# Patient Record
Sex: Male | Born: 1980 | Race: Black or African American | Hispanic: No | Marital: Married | Smoking: Never smoker
Health system: Southern US, Community
[De-identification: ages and names within clinical notes are randomized; demographics above are authoritative.]

## PROBLEM LIST (undated history)

## (undated) DIAGNOSIS — I10 Essential (primary) hypertension: Secondary | ICD-10-CM

## (undated) DIAGNOSIS — G4730 Sleep apnea, unspecified: Secondary | ICD-10-CM

## (undated) DIAGNOSIS — G4710 Hypersomnia, unspecified: Secondary | ICD-10-CM

## (undated) HISTORY — DX: Essential (primary) hypertension: I10

## (undated) HISTORY — DX: Hypersomnia, unspecified: G47.10

---

## 2003-01-12 ENCOUNTER — Emergency Department (HOSPITAL_COMMUNITY): Admission: EM | Admit: 2003-01-12 | Discharge: 2003-01-12 | Payer: Self-pay | Admitting: Emergency Medicine

## 2004-10-17 ENCOUNTER — Emergency Department (HOSPITAL_COMMUNITY): Admission: EM | Admit: 2004-10-17 | Discharge: 2004-10-18 | Payer: Self-pay | Admitting: Emergency Medicine

## 2009-02-18 ENCOUNTER — Emergency Department (HOSPITAL_COMMUNITY): Admission: EM | Admit: 2009-02-18 | Discharge: 2009-02-18 | Payer: Self-pay | Admitting: Family Medicine

## 2016-06-11 DIAGNOSIS — R030 Elevated blood-pressure reading, without diagnosis of hypertension: Secondary | ICD-10-CM | POA: Diagnosis not present

## 2016-06-11 DIAGNOSIS — J020 Streptococcal pharyngitis: Secondary | ICD-10-CM | POA: Diagnosis not present

## 2017-01-15 DIAGNOSIS — R030 Elevated blood-pressure reading, without diagnosis of hypertension: Secondary | ICD-10-CM | POA: Diagnosis not present

## 2017-01-15 DIAGNOSIS — J039 Acute tonsillitis, unspecified: Secondary | ICD-10-CM | POA: Diagnosis not present

## 2017-03-28 DIAGNOSIS — J020 Streptococcal pharyngitis: Secondary | ICD-10-CM | POA: Diagnosis not present

## 2017-12-08 DIAGNOSIS — H04123 Dry eye syndrome of bilateral lacrimal glands: Secondary | ICD-10-CM | POA: Diagnosis not present

## 2017-12-08 DIAGNOSIS — H40033 Anatomical narrow angle, bilateral: Secondary | ICD-10-CM | POA: Diagnosis not present

## 2018-02-19 ENCOUNTER — Ambulatory Visit: Payer: BLUE CROSS/BLUE SHIELD | Admitting: Internal Medicine

## 2018-02-19 ENCOUNTER — Encounter: Payer: Self-pay | Admitting: Internal Medicine

## 2018-02-19 VITALS — BP 148/88 | HR 71 | Temp 97.7°F | Ht 71.0 in | Wt 232.2 lb

## 2018-02-19 DIAGNOSIS — J302 Other seasonal allergic rhinitis: Secondary | ICD-10-CM

## 2018-02-19 DIAGNOSIS — G4710 Hypersomnia, unspecified: Secondary | ICD-10-CM | POA: Diagnosis not present

## 2018-02-19 DIAGNOSIS — J351 Hypertrophy of tonsils: Secondary | ICD-10-CM | POA: Diagnosis not present

## 2018-02-19 DIAGNOSIS — I10 Essential (primary) hypertension: Secondary | ICD-10-CM

## 2018-02-19 DIAGNOSIS — R0683 Snoring: Secondary | ICD-10-CM | POA: Diagnosis not present

## 2018-02-19 MED ORDER — LISINOPRIL 2.5 MG PO TABS: 2.5000 mg | ORAL_TABLET | Freq: Every day | ORAL | 0 refills | Status: DC

## 2018-02-19 MED ORDER — FEXOFENADINE HCL 180 MG PO TABS: 180.0000 mg | ORAL_TABLET | Freq: Every day | ORAL | 0 refills | Status: DC

## 2018-02-19 MED ORDER — FEXOFENADINE-PSEUDOEPHED ER 60-120 MG PO TB12: 1.0000 | ORAL_TABLET | Freq: Two times a day (BID) | ORAL | 0 refills | Status: DC

## 2018-02-19 MED ORDER — FLUTICASONE PROPIONATE 50 MCG/ACT NA SUSP: 2.0000 | NASAL | 0 refills | Status: DC

## 2018-02-19 NOTE — Patient Instructions (Signed)
Sleep Apnea Sleep apnea is a condition that affects breathing. People with sleep apnea have moments during sleep when their breathing pauses briefly or gets shallow. Sleep apnea can cause these symptoms:  Trouble staying asleep.  Sleepiness or tiredness during the day.  Irritability.  Loud snoring.  Morning headaches.  Trouble concentrating.  Forgetting things.  Less interest in sex.  Being sleepy for no reason.  Mood swings.  Personality changes.  Depression.  Waking up a lot during the night to pee (urinate).  Dry mouth.  Sore throat.  Follow these instructions at home:  Make any changes in your routine that your doctor recommends.  Eat a healthy, well-balanced diet.  Take over-the-counter and prescription medicines only as told by your doctor.  Avoid using alcohol, calming medicines (sedatives), and narcotic medicines.  Take steps to lose weight if you are overweight.  If you were given a machine (device) to use while you sleep, use it only as told by your doctor.  Do not use any tobacco products, such as cigarettes, chewing tobacco, and e-cigarettes. If you need help quitting, ask your doctor.  Keep all follow-up visits as told by your doctor. This is important. Contact a doctor if:  The machine that you were given to use during sleep is uncomfortable or does not seem to be working.  Your symptoms do not get better.  Your symptoms get worse. Get help right away if:  Your chest hurts.  You have trouble breathing in enough air (shortness of breath).  You have an uncomfortable feeling in your back, arms, or stomach.  You have trouble talking.  One side of your body feels weak.  A part of your face is hanging down (drooping). These symptoms may be an emergency. Do not wait to see if the symptoms will go away. Get medical help right away. Call your local emergency services (911 in the [place] Do not drive yourself to the hospital. This information  is not intended to replace advice given to you by your health care provider. Make sure you discuss any questions you have with your health care provider. Document Released: 01/04/2008 Document Revised: 11/21/2015 Document Reviewed: 01/04/2015 Elsevier Interactive Patient Education  2018 [employer]. Narcolepsy Narcolepsy is a neurological disorder that causes you to fall asleep suddenly, and without control, during the daytime (sleep attacks). Narcolepsy is a lifelong (chronic) disorder. Normally, sleep follows a regular cycle over the course of the night. After about 90 minutes of light sleep, your sleep should become deeper. When your sleep becomes deeper, your body moves less and you start dreaming. This type of deep sleep is called rapid eye movement (REM) sleep. When you have narcolepsy, your REM sleep is not well-regulated. This disrupts your sleep cycle, which causes daytime sleepiness. What are the causes? The cause of narcolepsy is not fully understood, but it may be related to:  Low levels of hypocretin, a chemical (neurotransmitter) in the brain that controls sleep and wake cycles. Hypocretin imbalance may be caused by: ? Abnormal genes that are passed from parent to child (inherited). ? The body's defense system (immune system) attacking hypocretin brain cells (autoimmune disease).  Infection, tumor, or injury in the area of the brain that controls sleep.  Exposure to poisons (toxins), such as heavy metals, pesticides, and secondhand smoke.  What are the signs or symptoms? Symptoms of this condition include:  Excessive daytime sleepiness. This is the most common symptom and is usually the first symptom you will notice. This may  affect your performance at work or school.  Sleep attacks. This means falling asleep suddenly and without control. You may fall asleep in the middle of an activity, especially low-energy activities like reading or watching TV.  Feeling like you cannot  think clearly.  Trouble focusing or remembering things.  Feeling depressed.  Sudden muscle weakness (cataplexy). When this occurs, your speech may become slurred, or your knees may buckle. Cataplexy is usually triggered by surprise, anger, fear, or laughter.  Loss of the ability to speak or move (sleep paralysis). This may occur just as you start to fall asleep or wake up. You will be aware of the paralysis. It usually lasts for just a few seconds or minutes.  Seeing, hearing, tasting, smelling, or feeling things that are not real (hallucinations). Hallucinations may occur with sleep paralysis. They can happen when you are falling asleep, waking up, or dozing.  Trouble staying asleep at night (insomnia).  Restless sleep.  How is this diagnosed? This condition may be diagnosed based on:  A physical exam to rule out any other problems that may be causing your symptoms.You may be asked to write down your sleeping patterns for several weeks in a sleep diary. This will help your health care provider make a diagnosis.  Sleep studies that measure how well your REM sleep is regulated. These tests also measure your heart rate, breathing, movement, and brain waves. These tests include: ? An overnight sleep study (polysomnogram). ? A daytime sleep study that is done while you take several naps during the day (multiple sleep latency test, MSLT). This test measures how quickly you fall asleep and how quickly you enter REM sleep.  Removal of spinal fluid to measure hypocretin levels.  How is this treated? There is no cure for this condition, but treatment can help relieve symptoms. Treatment may include:  Lifestyle and sleeping strategies to help you cope with the condition, such as: ? Exercising regularly. ? Maintaining a regular sleep schedule. ? Avoiding caffeine and large meals before bed.  Medicines. These may include: ? Medicines that help keep you awake and alert (stimulants) to fight  daytime sleepiness. ? Medicines that treat depression (antidepressants). These may be used to treat cataplexy. ? Sodium oxybate. This is a strong medicine to help you relax (sedative) that you may take at night. It can help control daytime sleepiness and cataplexy.  Follow these instructions at home: Sleeping habits  Get about 8 hours of sleep every night.  Go to sleep and get up at about the same time every day.  Keep your bedroom dark, quiet, and comfortable.  When you feel very tired, take short naps. Schedule naps so that you take them at about the same time every day.  Tell your employer or teachers that you have narcolepsy. You may be able to adjust your schedule to include time for naps.  Before bedtime: ? Avoid bright lights and screens. ? Relax. Try activities like reading or taking a warm bath. Activity  Get at least 20 minutes of exercise every day. This will help you sleep better at night and reduce daytime sleepiness.  Avoid exercising within 3 hours of bedtime.  If you are sleepy, do not drive or use heavy machinery.  If possible, take a nap before driving.  Do not swim or go out on the water without a life jacket. Eating and drinking  Do not drink alcohol or caffeinated beverages within 4-5 hours of bedtime.  Do not eat a lot of  food before bedtime. Eat meals at about the same times every day. General instructions  Take over-the-counter and prescription medicines only as told by your health care provider.  If directed, keep a sleep diary.  Tell your employer or teachers that you have narcolepsy. You may be able to adjust your schedule to include time for naps.  Do not use any products that contain nicotine or tobacco, such as cigarettes and e-cigarettes. If you need help quitting, ask your health care provider.  Keep all follow-up visits as told by your health care provider. This is important. Contact a health care provider if:  Your symptoms are not  getting better.  You have increasingly high blood pressure (hypertension).  You have changes in your heart rhythm.  You are having a hard time determining what is real and what is not (psychosis). Get help right away if:  You hurt yourself during a sleep attack or an attack of cataplexy.  You have chest pain.  You have trouble breathing. This information is not intended to replace advice given to you by your health care provider. Make sure you discuss any questions you have with your health care provider. Document Released: 03/17/2002 Document Revised: 03/20/2016 Document Reviewed: 03/20/2016 Elsevier Interactive Patient Education  2018 [employer]. Hypertension Hypertension is another name for high blood pressure. High blood pressure forces your heart to work harder to pump blood. This can cause problems over time. There are two numbers in a blood pressure reading. There is a top number (systolic) over a bottom number (diastolic). It is best to have a blood pressure below 120/80. Healthy choices can help lower your blood pressure. You may need medicine to help lower your blood pressure if:  Your blood pressure cannot be lowered with healthy choices.  Your blood pressure is higher than 130/80.  Follow these instructions at home: Eating and drinking  If directed, follow the DASH eating plan. This diet includes: ? Filling half of your plate at each meal with fruits and vegetables. ? Filling one quarter of your plate at each meal with whole grains. Whole grains include whole wheat pasta, brown rice, and whole grain bread. ? Eating or drinking low-fat dairy products, such as skim milk or low-fat yogurt. ? Filling one quarter of your plate at each meal with low-fat (lean) proteins. Low-fat proteins include fish, skinless chicken, eggs, beans, and tofu. ? Avoiding fatty meat, cured and processed meat, or chicken with skin. ? Avoiding premade or processed food.  Eat less than 1,500 mg  of salt (sodium) a day.  Limit alcohol use to no more than 1 drink a day for nonpregnant women and 2 drinks a day for men. One drink equals 12 oz of beer, 5 oz of wine, or 1 oz of hard liquor. Lifestyle  Work with your doctor to stay at a healthy weight or to lose weight. Ask your doctor what the best weight is for you.  Get at least 30 minutes of exercise that causes your heart to beat faster (aerobic exercise) most days of the week. This may include walking, swimming, or biking.  Get at least 30 minutes of exercise that strengthens your muscles (resistance exercise) at least 3 days a week. This may include lifting weights or pilates.  Do not use any products that contain nicotine or tobacco. This includes cigarettes and e-cigarettes. If you need help quitting, ask your doctor.  Check your blood pressure at home as told by your doctor.  Keep all follow-up  visits as told by your doctor. This is important. Medicines  Take over-the-counter and prescription medicines only as told by your doctor. Follow directions carefully.  Do not skip doses of blood pressure medicine. The medicine does not work as well if you skip doses. Skipping doses also puts you at risk for problems.  Ask your doctor about side effects or reactions to medicines that you should watch for. Contact a doctor if:  You think you are having a reaction to the medicine you are taking.  You have headaches that keep coming back (recurring).  You feel dizzy.  You have swelling in your ankles.  You have trouble with your vision. Get help right away if:  You get a very bad headache.  You start to feel confused.  You feel weak or numb.  You feel faint.  You get very bad pain in your: ? Chest. ? Belly (abdomen).  You throw up (vomit) more than once.  You have trouble breathing. Summary  Hypertension is another name for high blood pressure.  Making healthy choices can help lower blood pressure. If your blood  pressure cannot be controlled with healthy choices, you may need to take medicine. This information is not intended to replace advice given to you by your health care provider. Make sure you discuss any questions you have with your health care provider. Document Released: 09/13/2007 Document Revised: 02/23/2016 Document Reviewed: 02/23/2016 Elsevier Interactive Patient Education  [employer]2018 [employer] Inc[employer]

## 2018-02-19 NOTE — Addendum Note (Signed)
Addended by: Radene KneeSOUTHWORTH, Sukari Grist Y on: 02/19/2018 04:47 PM   Modules accepted: Orders

## 2018-02-19 NOTE — Progress Notes (Signed)
Subjective:     Patient ID: Erik West , male    DOB: [birth year] , 37 y.o.   MRN: [id number]   Chief Complaint  Patient presents with  . New Patient (Initial Visit)  . Snoring    C/O snoring really loud at night, stops breathing during sleep   . Fatigue    HPI Wife noticed apnea spells x 1-2 years. Lost wt which did not help.F[place] and was in a wreck. He also has chronic allergies and was told at one point he may need to have his tonsils removed, but never saw ENT.  He falls asleep any time, and sometimes he does not even realize it.    History reviewed. No pertinent past medical history.   History reviewed. No pertinent family history.  No current outpatient medications on file.   Review of Systems  Constitutional: Negative for unexpected weight change.  HENT: Positive for postnasal drip. Negative for ear discharge, ear pain and rhinorrhea.        Always sounds nasal  Respiratory: Negative for shortness of breath.   Cardiovascular: Negative for chest pain and palpitations.  Gastrointestinal: Negative for constipation, diarrhea, nausea and vomiting.       No GERD  Allergic/Immunologic: Positive for environmental allergies.  Neurological:       Hypersomnia, + snorring    Today's Vitals   02/19/18 1156  BP: (!) 148/88  Pulse: 71  Temp: 97.7 F (36.5 C)  TempSrc: Oral  SpO2: 95%  Weight: 232 lb 3.2 oz (105.3 kg)  Height: 5\' 11"  (1.803 m)   Body mass index is 32.39 kg/m.  Repeated BP- 152/110 Objective:  Physical Exam  Constitutional: He is oriented to person, place, and time. He appears well-developed and well-nourished. No distress.  He fell asleep twice while I was seeing his wife  HENT:  Head: Normocephalic.  Right Ear: External ear normal.  Left Ear: External ear normal.  Nose: Nose normal.  Mouth/Throat: Oropharynx is clear and moist. No oropharyngeal exudate.  He sounds nasal. Has moderate swelling of nasal mucosa which is pink pale.   Both TM's normal. Has hypertrophic tonsils almost touching uvula.   Eyes: Conjunctivae are normal. Right eye exhibits no discharge. Left eye exhibits no discharge. No scleral icterus.  Neck: Neck supple. No tracheal deviation present. No thyromegaly present.  Has enlarged tonsillar nodes  Cardiovascular: Normal rate, regular rhythm and normal heart sounds.  No murmur heard. Pulmonary/Chest: Effort normal and breath sounds normal. He has no wheezes. He has no rales.  Lymphadenopathy:    He has cervical adenopathy.  Neurological: He is alert and oriented to person, place, and time.  Skin: Skin is warm and dry. He is not diaphoretic.  Psychiatric: He has a normal mood and affect. His behavior is normal. Judgment and thought content normal.    Assessment And Plan:    1. Seasonal allergies- chronic. Advised to use his allegra  - Ambulatory referral to ENT  2. Snores- sent to Neurology for sleep study - Ambulatory referral to Neurology - Ambulatory referral to ENT  3. Enlarged tonsils- chronic. Sent to see ENT - Ambulatory referral to ENT  4. Hypersomnia- Sent to get eval and sleep study. I discussed with me that he may have Narcolepsy.  - Ambulatory referral to Neurology  5. Essential hypertension- acute. I placed him on Lisinopril 2.5 mg qd. FU in 1 month. Needs to do BP diaries.  - CBC no Diff - TSH -  T3, free - T4, Free - Lipid Profile - CMP - EKG 12-Lead- was normal.     Myliyah Rebuck RODRIGUEZ-SOUTHWORTH, PA-C

## 2018-02-20 LAB — TSH: TSH: 1.73 u[IU]/mL (ref 0.450–4.500)

## 2018-02-20 LAB — CBC
HEMATOCRIT: 45.4 % (ref 37.5–51.0)
HEMOGLOBIN: 15.3 g/dL (ref 13.0–17.7)
MCH: 28.4 pg (ref 26.6–33.0)
MCHC: 33.7 g/dL (ref 31.5–35.7)
MCV: 84 fL (ref 79–97)
Platelets: 263 10*3/uL (ref 150–450)
RBC: 5.38 x10E6/uL (ref 4.14–5.80)
RDW: 13.6 % (ref 12.3–15.4)
WBC: 5.6 10*3/uL (ref 3.4–10.8)

## 2018-02-20 LAB — LIPID PANEL
CHOL/HDL RATIO: 5.2 ratio — AB (ref 0.0–5.0)
Cholesterol, Total: 188 mg/dL (ref 100–199)
HDL: 36 mg/dL — AB (ref >39)
LDL CALC: 133 mg/dL — AB (ref 0–99)
TRIGLYCERIDES: 95 mg/dL (ref 0–149)
VLDL CHOLESTEROL CAL: 19 mg/dL (ref 5–40)

## 2018-02-20 LAB — T4, FREE: Free T4: 1.11 ng/dL (ref 0.82–1.77)

## 2018-02-20 LAB — T3, FREE: T3, Free: 3.1 pg/mL (ref 2.0–4.4)

## 2018-02-22 ENCOUNTER — Encounter: Payer: Self-pay | Admitting: Internal Medicine

## 2018-02-26 ENCOUNTER — Telehealth: Payer: Self-pay

## 2018-02-26 NOTE — Telephone Encounter (Signed)
Pt is on the waitlist for sleep consults. I called pt and offered him two open appts for tomorrow but pt cannot come to appts unless they are after 2pm. Will try again another time.

## 2018-03-11 DIAGNOSIS — J351 Hypertrophy of tonsils: Secondary | ICD-10-CM | POA: Insufficient documentation

## 2018-03-11 DIAGNOSIS — Z87891 Personal history of nicotine dependence: Secondary | ICD-10-CM | POA: Diagnosis not present

## 2018-03-11 DIAGNOSIS — Z7289 Other problems related to lifestyle: Secondary | ICD-10-CM | POA: Diagnosis not present

## 2018-03-11 DIAGNOSIS — G4733 Obstructive sleep apnea (adult) (pediatric): Secondary | ICD-10-CM | POA: Insufficient documentation

## 2018-03-11 DIAGNOSIS — J343 Hypertrophy of nasal turbinates: Secondary | ICD-10-CM | POA: Diagnosis not present

## 2018-03-14 LAB — CMP14 + ANION GAP
ALT: 34 IU/L (ref 0–44)
ANION GAP: 22 mmol/L — AB (ref 10.0–18.0)
AST: 24 IU/L (ref 0–40)
Albumin/Globulin Ratio: 1.5 (ref 1.2–2.2)
Albumin: 4.2 g/dL (ref 3.5–5.5)
Alkaline Phosphatase: 104 IU/L (ref 39–117)
BUN/Creatinine Ratio: 9 (ref 9–20)
BUN: 10 mg/dL (ref 6–20)
Bilirubin Total: 0.4 mg/dL (ref 0.0–1.2)
CALCIUM: 9.5 mg/dL (ref 8.7–10.2)
CO2: 19 mmol/L — ABNORMAL LOW (ref 20–29)
CREATININE: 1.14 mg/dL (ref 0.76–1.27)
Chloride: 100 mmol/L (ref 96–106)
GFR, EST AFRICAN AMERICAN: 94 mL/min/{1.73_m2} (ref >59)
GFR, EST NON AFRICAN AMERICAN: 82 mL/min/{1.73_m2} (ref >59)
GLOBULIN, TOTAL: 2.8 g/dL (ref 1.5–4.5)
Glucose: 77 mg/dL (ref 65–99)
Potassium: 4.4 mmol/L (ref 3.5–5.2)
SODIUM: 141 mmol/L (ref 134–144)
TOTAL PROTEIN: 7 g/dL (ref 6.0–8.5)

## 2018-03-14 LAB — SPECIMEN STATUS REPORT

## 2018-03-26 ENCOUNTER — Encounter: Payer: Self-pay | Admitting: Internal Medicine

## 2018-03-26 ENCOUNTER — Ambulatory Visit: Payer: BLUE CROSS/BLUE SHIELD | Admitting: Internal Medicine

## 2018-03-26 VITALS — BP 140/90 | HR 89 | Temp 98.2°F | Ht 71.0 in | Wt 228.8 lb

## 2018-03-26 DIAGNOSIS — L409 Psoriasis, unspecified: Secondary | ICD-10-CM | POA: Diagnosis not present

## 2018-03-26 DIAGNOSIS — I10 Essential (primary) hypertension: Secondary | ICD-10-CM

## 2018-03-26 MED ORDER — LISINOPRIL 5 MG PO TABS: 5.0000 mg | ORAL_TABLET | Freq: Every day | ORAL | 1 refills | Status: DC

## 2018-03-26 NOTE — Progress Notes (Signed)
  Subjective:     Patient ID: Erik West , male    DOB: 1981-04-08 , 37 y.o.   MRN: [id number]   Chief Complaint  Patient presents with  . Hypertension  . Hair/Scalp Problem    C/O dryness and flaky and itchy     HPI 1-Here for HTN FU, he forgot his BP diary and ran out of lisinopril last week. But he called later on with his readings and they have been 155-171/104-11. Has seen ENT last week. Was given the option to have tonsillectomy, or wait on results of sleep study which is what they will wait on.  Will see sleep Dr Next month.   Has been eating better. He has not been exercising.  2- has silver, white dry skin on top of his scalp for 2 years and thought it was dry skin or dandruff and has tried dandruff shampoo and oili the area.  History reviewed. No pertinent past medical history.   History reviewed. No pertinent family history.   Current Outpatient Medications:  .  fexofenadine (ALLEGRA) 180 MG tablet, Take 1 tablet (180 mg total) by mouth daily., Disp: 90 tablet, Rfl: 0 .  fluticasone (FLONASE) 50 MCG/ACT nasal spray, Place 2 sprays into both nostrils once a week., Disp: 16 g, Rfl: 0 .  lisinopril (ZESTRIL) 2.5 MG tablet, Take 1 tablet (2.5 mg total) by mouth daily., Disp: 30 tablet, Rfl: 0   No Known Allergies   Review of Systems  Constitutional: Negative for diaphoresis.  Respiratory: Negative for chest tightness and shortness of breath.   Cardiovascular: Negative for chest pain, palpitations and leg swelling.  Gastrointestinal: Negative for constipation, diarrhea and nausea.  Musculoskeletal:       No leg cramps.  Neurological: Negative for headaches.       + HYPERSOMNIA     Today's Vitals   03/26/18 1122  BP: 140/90  Pulse: 89  Temp: 98.2 F (36.8 C)  TempSrc: Oral  SpO2: 96%  Weight: 228 lb 12.8 oz (103.8 kg)  Height: 5\' 11"  (1.803 m)   Body mass index is 31.91 kg/m.   Objective:  Physical Exam  Weight down almost 4 lbs Constitutional: he is  oriented to person, place, and time. he appears well-developed and well-nourished. No distress. He kept on falling asleep while getting his and his wife's history.  HENT:  Head: Normocephalic and atraumatic.  Right Ear: External ear normal.  Left Ear: External ear normal.  Nose: Nose normal.  Eyes: Conjunctivae are normal. Right eye exhibits no discharge. Left eye exhibits no discharge. No scleral icterus.  Neck: Neck supple. No thyromegaly present.  No carotid bruits bilaterally  Cardiovascular: Normal rate and regular rhythm.  No murmur heard. Pulmonary/Chest: Effort normal and breath sounds normal. No respiratory distress.  Musculoskeletal: Normal range of motion. She exhibits no edema.  Lymphadenopathy: he has no cervical adenopathy.  Neurological: She is alert and oriented to person, place, and time.  Skin: Skin is warm and dry. Capillary refill takes less than 2 seconds. No rash noted. he is not diaphoretic.  Psychiatric: She has a normal mood and affect. Her behavior is normal. Judgment and thought content normal.  Nursing note reviewed.  Assessment And Plan:    1. Essential hypertension- not well controlled  2. Psoriasis- of scalp. Advised to use T-gel shampoo.   Fu 1 month. I increased his BP med to 5 mg.  Erik Kristensen RODRIGUEZ-SOUTHWORTH, PA-C

## 2018-03-27 ENCOUNTER — Other Ambulatory Visit: Payer: Self-pay | Admitting: Internal Medicine

## 2018-04-17 ENCOUNTER — Other Ambulatory Visit: Payer: Self-pay | Admitting: Internal Medicine

## 2018-04-17 ENCOUNTER — Emergency Department (HOSPITAL_COMMUNITY): Payer: BLUE CROSS/BLUE SHIELD

## 2018-04-17 ENCOUNTER — Emergency Department (HOSPITAL_COMMUNITY)
Admission: EM | Admit: 2018-04-17 | Discharge: 2018-04-17 | Disposition: A | Discharge status: Home / Self Care | Payer: BLUE CROSS/BLUE SHIELD | Attending: Emergency Medicine | Admitting: Emergency Medicine

## 2018-04-17 ENCOUNTER — Encounter (HOSPITAL_COMMUNITY): Payer: Self-pay | Admitting: Emergency Medicine

## 2018-04-17 DIAGNOSIS — S199XXA Unspecified injury of neck, initial encounter: Secondary | ICD-10-CM | POA: Diagnosis not present

## 2018-04-17 DIAGNOSIS — Y999 Unspecified external cause status: Secondary | ICD-10-CM | POA: Diagnosis not present

## 2018-04-17 DIAGNOSIS — Y939 Activity, unspecified: Secondary | ICD-10-CM | POA: Insufficient documentation

## 2018-04-17 DIAGNOSIS — S161XXA Strain of muscle, fascia and tendon at neck level, initial encounter: Secondary | ICD-10-CM | POA: Insufficient documentation

## 2018-04-17 DIAGNOSIS — Y92410 Unspecified street and highway as the place of occurrence of the external cause: Secondary | ICD-10-CM | POA: Insufficient documentation

## 2018-04-17 DIAGNOSIS — Z79899 Other long term (current) drug therapy: Secondary | ICD-10-CM | POA: Diagnosis not present
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
## 2018-04-17 NOTE — Discharge Instructions (Addendum)
There were no serious findings on the CT images.  Use ice on the sore areas for 2 days after that use heat.  For pain, use ibuprofen 400 mg 3 times a day with meals.  See your doctor for checkup next week.

## 2018-04-18 ENCOUNTER — Other Ambulatory Visit: Payer: Self-pay | Admitting: Internal Medicine

## 2018-04-22 ENCOUNTER — Encounter: Payer: Self-pay | Admitting: Nurse Practitioner

## 2018-04-22 ENCOUNTER — Ambulatory Visit: Payer: BLUE CROSS/BLUE SHIELD | Admitting: Nurse Practitioner

## 2018-04-22 VITALS — BP 148/110 | HR 70 | Temp 98.1°F | Ht 70.6 in | Wt 227.6 lb

## 2018-04-22 DIAGNOSIS — M542 Cervicalgia: Secondary | ICD-10-CM | POA: Diagnosis not present

## 2018-04-22 DIAGNOSIS — I10 Essential (primary) hypertension: Secondary | ICD-10-CM | POA: Diagnosis not present

## 2018-04-22 DIAGNOSIS — M62838 Other muscle spasm: Secondary | ICD-10-CM | POA: Insufficient documentation

## 2018-04-22 MED ORDER — CYCLOBENZAPRINE HCL 10 MG PO TABS: 10.0000 mg | ORAL_TABLET | Freq: Three times a day (TID) | ORAL | 0 refills | Status: DC | PRN

## 2018-04-22 NOTE — Patient Instructions (Signed)
Cervical Sprain    A cervical sprain is a stretch or tear in the tissues that connect bones (ligaments) in the neck. Most neck (cervical) sprains get better in 4-6 weeks.  Follow these instructions at home:  If you have a neck collar:   Wear it as told by your doctor. Do not take off (do not remove) the collar unless your doctor says that this is safe.   Ask your doctor before adjusting your collar.   If you have long hair, keep it outside of the collar.   Ask your doctor if you may take off the collar for cleaning and bathing. If you may take off the collar:  ? Follow instructions from your doctor about how to take off the collar safely.  ? Clean the collar by wiping it with mild soap and water. Let it air-dry all the way.  ? If your collar has removable pads:   Take the pads out every 1-2 days.   Hand wash the pads with soap and water.   Let the pads air-dry all the way before you put them back in the collar. Do not dry them in a clothes dryer. Do not dry them with a hair dryer.  ? Check your skin under the collar for irritation or sores. If you see any, tell your doctor.  Managing pain, stiffness, and swelling     Use a cervical traction device, if told by your doctor.   If told, put heat on the affected area. Do this before exercises (physical therapy) or as often as told by your doctor. Use the heat source that your doctor recommends, such as a moist heat pack or a heating pad.  ? Place a towel between your skin and the heat source.  ? Leave the heat on for 20-30 minutes.  ? Take the heat off (remove the heat) if your skin turns bright red. This is very important if you cannot feel pain, heat, or cold. You may have a greater risk of getting burned.   Put ice on the affected area.  ? Put ice in a plastic bag.  ? Place a towel between your skin and the bag.  ? Leave the ice on for 20 minutes, 2-3 times a day.  Activity   Do not drive while wearing a neck collar. If you do not have a neck collar, ask  your doctor if it is safe to drive.   Do not drive or use heavy machinery while taking prescription pain medicine or muscle relaxants, unless your doctor approves.   Do not lift anything that is heavier than 10 lb (4.5 kg) until your doctor tells you that it is safe.   Rest as told by your doctor.   Avoid activities that make you feel worse. Ask your doctor what activities are safe for you.   Do exercises as told by your doctor or physical therapist.  Preventing neck sprain   Practice good posture. Adjust your workstation to help with this, if needed.   Exercise regularly as told by your doctor or physical therapist.   Avoid activities that are risky or may cause a neck sprain (cervical sprain).  General instructions   Take over-the-counter and prescription medicines only as told by your doctor.   Do not use any products that contain nicotine or tobacco. This includes cigarettes and e-cigarettes. If you need help quitting, ask your doctor.   Keep all follow-up visits as told by your doctor. This is important.    Contact a doctor if:   You have pain or other symptoms that get worse.   You have symptoms that do not get better after 2 weeks.   You have pain that does not get better with medicine.   You start to have new, unexplained symptoms.   You have sores or irritated skin from wearing your neck collar.  Get help right away if:   You have very bad pain.   You have any of the following in any part of your body:  ? Loss of feeling (numbness).  ? Tingling.  ? Weakness.   You cannot move a part of your body (you have paralysis).   Your activity level does not improve.  Summary   A cervical sprain is a stretch or tear in the tissues that connect bones (ligaments) in the neck.   If you have a neck (cervical) collar, do not take off the collar unless your doctor says that this is safe.   Put ice on affected areas as told by your doctor.   Put heat on affected areas as told by your doctor.   Good  posture and regular exercise can help prevent a neck sprain from happening again.  This information is not intended to replace advice given to you by your health care provider. Make sure you discuss any questions you have with your health care provider.  Document Released: 09/13/2007 Document Revised: 12/07/2015 Document Reviewed: 12/07/2015  Elsevier Interactive Patient Education  2019 [employer]
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
## 2018-04-24 ENCOUNTER — Encounter

## 2018-04-24 ENCOUNTER — Ambulatory Visit (INDEPENDENT_AMBULATORY_CARE_PROVIDER_SITE_OTHER): Payer: BLUE CROSS/BLUE SHIELD | Admitting: Neurology

## 2018-04-24 ENCOUNTER — Encounter: Payer: Self-pay | Admitting: Neurology

## 2018-04-24 VITALS — BP 165/110 | HR 71 | Ht 71.0 in | Wt 227.0 lb

## 2018-04-24 DIAGNOSIS — G4719 Other hypersomnia: Secondary | ICD-10-CM

## 2018-04-24 DIAGNOSIS — Z9189 Other specified personal risk factors, not elsewhere classified: Secondary | ICD-10-CM

## 2018-04-24 DIAGNOSIS — R0683 Snoring: Secondary | ICD-10-CM | POA: Diagnosis not present

## 2018-04-24 DIAGNOSIS — R351 Nocturia: Secondary | ICD-10-CM

## 2018-04-24 DIAGNOSIS — E669 Obesity, unspecified: Secondary | ICD-10-CM

## 2018-04-24 DIAGNOSIS — R0681 Apnea, not elsewhere classified: Secondary | ICD-10-CM

## 2018-04-24 DIAGNOSIS — R030 Elevated blood-pressure reading, without diagnosis of hypertension: Secondary | ICD-10-CM
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
## 2018-04-24 NOTE — Patient Instructions (Signed)

## 2018-04-25 ENCOUNTER — Telehealth: Payer: Self-pay

## 2018-04-25 ENCOUNTER — Encounter: Payer: Self-pay | Admitting: Nurse Practitioner

## 2018-04-25 NOTE — Telephone Encounter (Signed)
Patient called stating he was seen on Monday and was told if he does not feel any better to call so we can refer him to a chiropractor pt was unable to go to work due to back and neck stiffness.   Pt advised we will order the referral and also letter provided him to be off until Tuesday. YRL,RMA

## 2018-05-07 DIAGNOSIS — R030 Elevated blood-pressure reading, without diagnosis of hypertension: Secondary | ICD-10-CM | POA: Insufficient documentation

## 2018-05-09 ENCOUNTER — Other Ambulatory Visit: Payer: Self-pay

## 2018-05-09 DIAGNOSIS — I10 Essential (primary) hypertension: Secondary | ICD-10-CM

## 2018-05-09 MED ORDER — LISINOPRIL 5 MG PO TABS: 2.5000 mg | ORAL_TABLET | Freq: Every day | ORAL | 1 refills | Status: DC

## 2018-05-13 ENCOUNTER — Ambulatory Visit: Payer: BLUE CROSS/BLUE SHIELD | Admitting: Neurology

## 2018-05-13 DIAGNOSIS — R0683 Snoring: Secondary | ICD-10-CM

## 2018-05-13 DIAGNOSIS — G4733 Obstructive sleep apnea (adult) (pediatric): Secondary | ICD-10-CM

## 2018-05-13 DIAGNOSIS — Z9189 Other specified personal risk factors, not elsewhere classified: Secondary | ICD-10-CM

## 2018-05-13 DIAGNOSIS — E669 Obesity, unspecified: Secondary | ICD-10-CM

## 2018-05-13 DIAGNOSIS — G4719 Other hypersomnia: Secondary | ICD-10-CM

## 2018-05-13 DIAGNOSIS — R351 Nocturia: Secondary | ICD-10-CM

## 2018-05-13 DIAGNOSIS — R030 Elevated blood-pressure reading, without diagnosis of hypertension: Secondary | ICD-10-CM

## 2018-05-13 DIAGNOSIS — R0681 Apnea, not elsewhere classified: Secondary | ICD-10-CM

## 2018-05-14 ENCOUNTER — Other Ambulatory Visit: Payer: Self-pay | Admitting: Internal Medicine

## 2018-05-14 DIAGNOSIS — I10 Essential (primary) hypertension: Secondary | ICD-10-CM

## 2018-05-21 ENCOUNTER — Telehealth: Payer: Self-pay

## 2018-05-21 NOTE — Progress Notes (Signed)
Patient referred by his primary care PA, seen by me on 04/24/18, HST on 05/14/18.    Please call and notify the patient that the recent home sleep test showed obstructive sleep apnea in the severe range. While I recommend treatment for this in the form CPAP, his insurance will not approve a sleep study for this. They will likely only approve a trial of autoPAP, which means, that we don't have to bring him in for a sleep study with CPAP, but will let him try an autoPAP machine at home, through a DME company (of his choice, or as per insurance requirement). The DME representative will educate him on how to use the machine, how to put the mask on, etc. I have placed an order in the chart. Please send referral, talk to patient, send report to referring MD. We will need a FU in sleep clinic for 10 weeks post-PAP set up, please arrange that with me or one of our NPs.  Please advise him, that I will also order an ONO for about 1 week post autoPAP set up as he had severe desaturations, as low as 59%. Thanks,   Huston Foley, MD, PhD [facility])

## 2018-05-21 NOTE — Procedures (Signed)
[facility] [address] [place], [place] [address] NAME:   Erik West                                                                                 DOB: 08-08-1980 MEDICAL RECORD no:  [id number]                                                    DOS: 05/14/2018  REFERRING PHYSICIAN: Garey Ham, PA-C STUDY PERFORMED: Home Sleep Test on Watch Pat HISTORY: 38 year old man with a history of seasonal allergies, tonsillar hypertrophy, hypertension and obesity, who reports snoring and excessive daytime somnolence as well as witnessed apneas. His Epworth sleepiness score is 16 out of 24. BMI of 37.  STUDY RESULTS:   Total Recording Time: 8 hrs, 13 mins; Total Sleep Time: 6 hrs, 46 mins Total Apnea/Hypopnea Index (AHI): 87.6/h; RDI: 87.8/h Average Oxygen Saturation: 93%; Lowest Oxygen Desaturation: 59%  Total Time Oxygen Saturation Below or at 88 %: 51.8 minutes  Average Heart Rate: 81 bpm (between 41 and  128 bpm) IMPRESSION: OSA RECOMMENDATION: This home sleep test demonstrates severe obstructive sleep apnea with a total AHI of 87.6/hour and O2 nadir of 59%. Given the patient's medical history and sleep related complaints, treatment with positive airway pressure (in the form of CPAP) is recommended. This will require a full night CPAP titration study for proper treatment settings, O2 monitoring and mask fitting. Based on the severity of the sleep disordered breathing an attended titration study is indicated. However, patient's insurance has denied an attended sleep study; therefore, the patient will be advised to proceed with an autoPAP titration/trial at home for now. Please note that untreated obstructive sleep apnea may carry additional perioperative morbidity. Patients with significant obstructive sleep apnea should receive perioperative PAP therapy and the surgeons and particularly the anesthesiologist should be informed of the diagnosis and the  severity of the sleep disordered breathing. The patient should be cautioned not to drive, work at heights, or operate dangerous or heavy equipment when tired or sleepy. Review and reiteration of good sleep hygiene measures should be pursued with any patient. Other causes of the patient's symptoms, including circadian rhythm disturbances, an underlying mood disorder, medication effect and/or an underlying medical problem cannot be ruled out based on this test. Clinical correlation is recommended. The patient and his referring provider will be notified of the test results. The patient will be seen in follow up in sleep clinic at [facility].  I certify that I have reviewed the raw data recording prior to the issuance of this report in accordance with the standards of the American Academy of Sleep Medicine (AASM).   Huston Foley, MD, PhD [facility]) Diplomat, ABPN (Neurology and Sleep)

## 2018-05-21 NOTE — Telephone Encounter (Signed)
-----   Message from Huston Foley, MD sent at 05/21/2018  7:40 AM EST ----- Patient referred by his primary care PA, seen by me on 04/24/18, HST on 05/14/18.    Please call and notify the patient that the recent home sleep test showed obstructive sleep apnea in the severe range. While I recommend treatment for this in the form CPAP, his insurance will not approve a sleep study for this. They will likely only approve a trial of autoPAP, which means, that we don't have to bring him in for a sleep study with CPAP, but will let him try an autoPAP machine at home, through a DME company (of his choice, or as per insurance requirement). The DME representative will educate him on how to use the machine, how to put the mask on, etc. I have placed an order in the chart. Please send referral, talk to patient, send report to referring MD. We will need a FU in sleep clinic for 10 weeks post-PAP set up, please arrange that with me or one of our NPs.  Please advise him, that I will also order an ONO for about 1 week post autoPAP set up as he had severe desaturations, as low as 59%. Thanks,   Huston Foley, MD, PhD [facility])

## 2018-05-21 NOTE — Telephone Encounter (Signed)
I called pt. I advised pt that Dr. Frances FurbishAthar reviewed their sleep study results and found that pt has severe osa. Dr. Frances FurbishAthar recommends that pt start an auto pap at home since pt's insurance will likely deny an in lab titration study. Pt also will need an ONO on auto pap after about one week of auto pap use since he had severe desatrations. I reviewed PAP compliance expectations with the pt. Pt is agreeable to starting an auto-PAP. I advised pt that an order will be sent to a DME, [facility], and [facility] will call the pt within about one week after they file with the pt's insurance. [facility] will show the pt how to use the machine, fit for masks, and troubleshoot the auto-PAP if needed. A follow up appt was made for insurance purposes with Dr. Frances FurbishAthar on 07/22/2018 at 11:30am. Pt verbalized understanding to arrive 15 minutes early and bring their auto-PAP. A letter with all of this information in it will be mailed to the pt as a reminder. I verified with the pt that the address we have on file is correct. Pt verbalized understanding of results. Pt had no questions at this time but was encouraged to call back if questions arise. I [facility] and have received confirmation that they have received the order.

## 2018-05-21 NOTE — Addendum Note (Signed)
Addended by: Huston Foley on: 05/21/2018 07:41 AM   Modules accepted: Orders

## 2018-05-28 ENCOUNTER — Ambulatory Visit: Payer: BLUE CROSS/BLUE SHIELD | Admitting: Internal Medicine

## 2018-05-28 ENCOUNTER — Encounter: Payer: Self-pay | Admitting: Internal Medicine

## 2018-05-28 VITALS — BP 148/100 | HR 98 | Temp 98.4°F | Ht 70.0 in | Wt 226.8 lb

## 2018-05-28 DIAGNOSIS — I10 Essential (primary) hypertension: Secondary | ICD-10-CM | POA: Diagnosis not present

## 2018-05-28 DIAGNOSIS — E7800 Pure hypercholesterolemia, unspecified: Secondary | ICD-10-CM | POA: Diagnosis not present

## 2018-05-28 MED ORDER — LISINOPRIL 10 MG PO TABS: 10.0000 mg | ORAL_TABLET | Freq: Every day | ORAL | 0 refills | Status: DC

## 2018-05-28 NOTE — Patient Instructions (Signed)

## 2018-05-28 NOTE — Progress Notes (Signed)
  Subjective:     Patient ID: Erik West , male    DOB: 1980-10-12 , 38 y.o.   MRN: [id number]   Chief Complaint  Patient presents with  . Hypertension    HPI  Here for HTN FU are still over 140/90 150-183/ 80-121  Has severe apnea and will get set up for a C-pap in a few days.     No past medical history on file.   Family History  Problem Relation Age of Onset  . Hypertension Paternal Grandmother   . Cancer Other      Current Outpatient Medications:  .  cyclobenzaprine (FLEXERIL) 10 MG tablet, Take 1 tablet (10 mg total) by mouth 3 (three) times daily as needed for muscle spasms., Disp: 30 tablet, Rfl: 0 .  fexofenadine (ALLEGRA) 180 MG tablet, Take 1 tablet (180 mg total) by mouth daily., Disp: 90 tablet, Rfl: 0 .  lisinopril (PRINIVIL,ZESTRIL) 5 MG tablet, TAKE 1 TABLET BY MOUTH EVERY DAY, Disp: 30 tablet, Rfl: 1   No Known Allergies   Review of Systems  Constitutional: Negative for chills, diaphoresis and fatigue.  HENT: Negative for congestion.   Eyes: Negative for visual disturbance.  Respiratory: Negative for cough, chest tightness, shortness of breath and stridor.   Cardiovascular: Negative for chest pain, palpitations and leg swelling.  Gastrointestinal: Negative for abdominal pain.  Genitourinary: Negative for difficulty urinating.  Skin: Negative for rash.  Neurological: Negative for dizziness and headaches.     Today's Vitals   05/28/18 1118  BP: (!) 148/100  Pulse: 98  Temp: 98.4 F (36.9 C)  TempSrc: Oral  Weight: 226 lb 12.8 oz (102.9 kg)  Height: 5\' 10"  (1.778 m)  PainSc: 0-No pain   Body mass index is 32.54 kg/m.   Objective:  Physical Exam   Constitutional: She is oriented to person, place, and time. She appears well-developed and well-nourished. No distress.  HENT:  Head: Normocephalic and atraumatic.  Right Ear: External ear normal.  Left Ear: External ear normal.  Nose: Nose normal.  Eyes: Conjunctivae are normal. Right eye  exhibits no discharge. Left eye exhibits no discharge. No scleral icterus.  Neck: Neck supple. No thyromegaly present.  No carotid bruits bilaterally  Cardiovascular: Normal rate and regular rhythm.  No murmur heard. Pulmonary/Chest: Effort normal and breath sounds normal. No respiratory distress.  Musculoskeletal: Normal range of motion. She exhibits no edema.  Lymphadenopathy:    She has no cervical adenopathy.  Neurological: She is alert and oriented to person, place, and time.  Skin: Skin is warm and dry. Capillary refill takes less than 2 seconds. No rash noted. She is not diaphoretic.  Psychiatric: She has a normal mood and affect. Her behavior is normal. Judgment and thought content normal.  Nursing note reviewed.     Assessment And Plan:   2. Essential hypertension- not controlled. I increased his lisinopril to 10 mg qd. FU in 1 month. Needs to continue doing BP diaries.  - CMP14 + Anion Gap - CBC no Diff  3. Elevated LDL cholesterol level- has been on a low fat diet.  - Lipid Profile   Jeronica Stlouis RODRIGUEZ-SOUTHWORTH, PA-C

## 2018-05-29 LAB — CBC
HEMATOCRIT: 45.6 % (ref 37.5–51.0)
HEMOGLOBIN: 15.9 g/dL (ref 13.0–17.7)
MCH: 29.2 pg (ref 26.6–33.0)
MCHC: 34.9 g/dL (ref 31.5–35.7)
MCV: 84 fL (ref 79–97)
Platelets: 282 10*3/uL (ref 150–450)
RBC: 5.44 x10E6/uL (ref 4.14–5.80)
RDW: 14.4 % (ref 11.6–15.4)
WBC: 6.8 10*3/uL (ref 3.4–10.8)

## 2018-05-29 LAB — CMP14 + ANION GAP
ALBUMIN: 4.5 g/dL (ref 4.0–5.0)
ALT: 38 IU/L (ref 0–44)
ANION GAP: 19 mmol/L — AB (ref 10.0–18.0)
AST: 19 IU/L (ref 0–40)
Albumin/Globulin Ratio: 1.4 (ref 1.2–2.2)
Alkaline Phosphatase: 111 IU/L (ref 39–117)
BILIRUBIN TOTAL: 0.7 mg/dL (ref 0.0–1.2)
BUN / CREAT RATIO: 10 (ref 9–20)
BUN: 11 mg/dL (ref 6–20)
CO2: 25 mmol/L (ref 20–29)
Calcium: 9.8 mg/dL (ref 8.7–10.2)
Chloride: 99 mmol/L (ref 96–106)
Creatinine, Ser: 1.07 mg/dL (ref 0.76–1.27)
GFR calc non Af Amer: 88 mL/min/{1.73_m2} (ref >59)
GFR, EST AFRICAN AMERICAN: 102 mL/min/{1.73_m2} (ref >59)
Globulin, Total: 3.2 g/dL (ref 1.5–4.5)
Glucose: 84 mg/dL (ref 65–99)
Potassium: 4.2 mmol/L (ref 3.5–5.2)
Sodium: 143 mmol/L (ref 134–144)
Total Protein: 7.7 g/dL (ref 6.0–8.5)

## 2018-05-29 LAB — LIPID PANEL
CHOL/HDL RATIO: 5.9 ratio — AB (ref 0.0–5.0)
Cholesterol, Total: 205 mg/dL — ABNORMAL HIGH (ref 100–199)
HDL: 35 mg/dL — ABNORMAL LOW (ref >39)
LDL CALC: 145 mg/dL — AB (ref 0–99)
Triglycerides: 127 mg/dL (ref 0–149)
VLDL Cholesterol Cal: 25 mg/dL (ref 5–40)

## 2018-05-30 DIAGNOSIS — G4733 Obstructive sleep apnea (adult) (pediatric): Secondary | ICD-10-CM | POA: Diagnosis not present

## 2018-06-14 ENCOUNTER — Other Ambulatory Visit: Payer: Self-pay | Admitting: Internal Medicine

## 2018-06-14 DIAGNOSIS — I10 Essential (primary) hypertension: Secondary | ICD-10-CM

## 2018-06-15 ENCOUNTER — Other Ambulatory Visit: Payer: Self-pay | Admitting: Internal Medicine

## 2018-06-21 ENCOUNTER — Other Ambulatory Visit: Payer: Self-pay

## 2018-06-21 DIAGNOSIS — I10 Essential (primary) hypertension: Secondary | ICD-10-CM

## 2018-06-21 MED ORDER — LISINOPRIL 10 MG PO TABS: 10.0000 mg | ORAL_TABLET | Freq: Every day | ORAL | 0 refills | Status: DC

## 2018-06-28 ENCOUNTER — Encounter: Payer: Self-pay | Admitting: Neurology

## 2018-06-28 DIAGNOSIS — G4733 Obstructive sleep apnea (adult) (pediatric): Secondary | ICD-10-CM | POA: Diagnosis not present

## 2018-07-16 ENCOUNTER — Encounter: Payer: Self-pay | Admitting: Neurology

## 2018-07-16 ENCOUNTER — Telehealth: Payer: Self-pay | Admitting: Neurology

## 2018-07-16 NOTE — Telephone Encounter (Signed)
Due to current COVID 19 pandemic, our office is severely reducing in office visits for at least the next 2 weeks, in order to minimize the risk to our patients and healthcare providers.   Called patient to offer him a sooner appointment via virtual visit with Dr. Frances Furbish. Patient gave consent. I explained to patient the steps to take in order to set up the [facility] meeting. Patient verbalized understanding. I advised patient that he will receive an e-mail with further instructions, as well as a phone call from Dr. Teofilo Pod nurse to go over history. I told patient to call back with any questions. He agreed.  Pt understands that although there may be some limitations with this type of visit, we will take all precautions to reduce any security or privacy concerns.  Pt understands that this will be treated like an in office visit and we will file with pt's insurance, and there may be a patient responsible charge related to this service.  Pt's email is [email or web address]. Pt understands that the [employer] webex software must be downloaded and operational on the device pt plans to use for the visit.

## 2018-07-17 NOTE — Telephone Encounter (Signed)
I called pt to update his chart and discuss his appt. No answer, VM is full. Will try again later.

## 2018-07-17 NOTE — Telephone Encounter (Signed)
Pt has returned call to RN Kristen, he is asking for a call back °

## 2018-07-17 NOTE — Telephone Encounter (Signed)
I called pt. Pt's meds, allergies, and PMH were updated.  Pt reports that he sometimes has mask issues. He has not worked with [facility] regarding his mask yet. Pt thinks he did an ONO on auto pap. We have not received those results.  Pt reports that he feels better on auto pap.  Epworth Sleepiness Scale 0= would never doze 1= slight chance of dozing 2= moderate chance of dozing 3= high chance of dozing  Sitting and reading: 0 Watching TV: 0 Sitting inactive in a public place (ex. Theater or meeting): 0 As a passenger in a car for an hour without a break: 1 Lying down to rest in the afternoon: 2 Sitting and talking to someone: 0 Sitting quietly after lunch (no alcohol): 1 In a car, while stopped in traffic: 0 Total: 4

## 2018-07-17 NOTE — Addendum Note (Signed)
Addended by: Geronimo Running A on: 07/17/2018 09:24 AM   Modules accepted: Orders

## 2018-07-17 NOTE — Telephone Encounter (Signed)
[facility] is checking into pt's ONO on auto pap results.

## 2018-07-22 ENCOUNTER — Encounter: Payer: Self-pay | Admitting: Neurology

## 2018-07-22 ENCOUNTER — Ambulatory Visit (INDEPENDENT_AMBULATORY_CARE_PROVIDER_SITE_OTHER): Payer: BLUE CROSS/BLUE SHIELD | Admitting: Neurology

## 2018-07-22 ENCOUNTER — Ambulatory Visit: Payer: Self-pay | Admitting: Neurology

## 2018-07-22 ENCOUNTER — Other Ambulatory Visit: Payer: Self-pay

## 2018-07-22 DIAGNOSIS — Z9989 Dependence on other enabling machines and devices: Secondary | ICD-10-CM | POA: Diagnosis not present

## 2018-07-22 DIAGNOSIS — Z789 Other specified health status: Secondary | ICD-10-CM

## 2018-07-22 DIAGNOSIS — G4733 Obstructive sleep apnea (adult) (pediatric): Secondary | ICD-10-CM

## 2018-07-22 NOTE — Patient Instructions (Addendum)
Given verbally, during today's virtual video-based encounter, with verbal feedback received.   1. Continue using autoPAP regularly with full compliance, patient is encouraged to be consistent with usage and talk to his DME company about a new mask, different style, also doing a overnight pulse oximetry test soon. 2. Follow-up in 3 months, with NP next time.  3. Call in 4-6 weeks so we can review another compliance download. 4. Call or email through My Chart for any interim questions or concerns. 5.   Obtain nasal saline rinse system.
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
## 2018-07-22 NOTE — Telephone Encounter (Signed)
Received this notice from [facility]: ""Patient was called twice on Wednesday and twice yesterday but he did not answer (and could not leave a message). Finally spoke with the patient today and he was not aware of this test and was very confused by my call. After speaking with him, he informed me that his appt with his doctor is at 9am on Monday, so there is pretty much no way we would be able to get the results to them by the appt time. I am scheduling the pickup of his ONO unit for today at 3:00 but he said there was a chance he might not be able to make it there because of work. And even if he did pick it up today, he would have to return it on Monday and then we would do the download that afternoon after it was brought back."   We will get the results to you as soon as we possibly can."

## 2018-07-22 NOTE — Progress Notes (Signed)
Order for mask refit/ONO sent to [facility] via community message. Confirmation received that the order transmitted was successful.

## 2018-07-29 DIAGNOSIS — G4733 Obstructive sleep apnea (adult) (pediatric): Secondary | ICD-10-CM | POA: Diagnosis not present

## 2018-08-08 ENCOUNTER — Ambulatory Visit: Payer: Self-pay | Admitting: Neurology

## 2018-08-27 ENCOUNTER — Ambulatory Visit: Payer: BLUE CROSS/BLUE SHIELD | Admitting: Internal Medicine

## 2018-08-28 DIAGNOSIS — G4733 Obstructive sleep apnea (adult) (pediatric): Secondary | ICD-10-CM | POA: Diagnosis not present

## 2018-09-11 ENCOUNTER — Encounter: Payer: Self-pay | Admitting: Internal Medicine

## 2018-09-12 ENCOUNTER — Ambulatory Visit: Payer: BLUE CROSS/BLUE SHIELD | Admitting: Internal Medicine

## 2018-09-17 ENCOUNTER — Other Ambulatory Visit: Payer: Self-pay | Admitting: Internal Medicine

## 2018-09-17 DIAGNOSIS — I10 Essential (primary) hypertension: Secondary | ICD-10-CM

## 2018-09-23 ENCOUNTER — Encounter: Payer: Self-pay | Admitting: Nurse Practitioner

## 2018-09-23 ENCOUNTER — Other Ambulatory Visit: Payer: Self-pay

## 2018-09-23 ENCOUNTER — Ambulatory Visit: Payer: BC Managed Care – PPO | Admitting: Nurse Practitioner

## 2018-09-23 VITALS — BP 120/86 | HR 80 | Temp 98.2°F | Ht 69.2 in | Wt 234.8 lb

## 2018-09-23 DIAGNOSIS — I10 Essential (primary) hypertension: Secondary | ICD-10-CM | POA: Diagnosis not present

## 2018-09-23 DIAGNOSIS — G4733 Obstructive sleep apnea (adult) (pediatric): Secondary | ICD-10-CM

## 2018-09-23 MED ORDER — HYDROCHLOROTHIAZIDE 12.5 MG PO TABS: 12.5000 mg | ORAL_TABLET | Freq: Every day | ORAL | 1 refills | Status: DC

## 2018-09-23 NOTE — Progress Notes (Signed)
  Subjective:     Patient ID: Erik West , male    DOB: 1980/11/26 , 38 y.o.   MRN: [id number]   Chief Complaint  Patient presents with  . Hypertension    HPI  Blood pressure readings at home have been 118/108  Hypertension This is a chronic problem. The problem has been gradually improving since onset. The problem is controlled. Pertinent negatives include no chest pain, headaches, palpitations or shortness of breath. There are no associated agents to hypertension. Risk factors for coronary artery disease include obesity and sedentary lifestyle. Past treatments include ACE inhibitors. There are no compliance problems.  There is no history of angina. There is no history of chronic renal disease.     Past Medical History:  Diagnosis Date  . Essential hypertension   . Hypersomnia      Family History  Problem Relation Age of Onset  . Hypertension Paternal Grandmother   . Cancer Other      Current Outpatient Medications:  .  fexofenadine (ALLEGRA) 180 MG tablet, TAKE 1 TABLET BY MOUTH EVERY DAY, Disp: 90 tablet, Rfl: 2 .  lisinopril (ZESTRIL) 10 MG tablet, TAKE 1 TABLET BY MOUTH EVERY DAY, Disp: 90 tablet, Rfl: 0   No Known Allergies   Review of Systems  Constitutional: Negative.   Respiratory: Negative.  Negative for shortness of breath.   Cardiovascular: Negative.  Negative for chest pain, palpitations and leg swelling.  Neurological: Negative for dizziness and headaches.     Today's Vitals   09/23/18 1145  BP: 120/86  Pulse: 80  Temp: 98.2 F (36.8 C)  TempSrc: Oral  Weight: 234 lb 12.8 oz (106.5 kg)  Height: 5' 9.2" (1.758 m)  PainSc: 0-No pain   Body mass index is 34.47 kg/m.   Objective:  Physical Exam Constitutional:      Appearance: Normal appearance.  Cardiovascular:     Rate and Rhythm: Normal rate and regular rhythm.  Skin:    General: Skin is warm and dry.     Capillary Refill: Capillary refill takes less than 2 seconds.  Neurological:   General: No focal deficit present.     Mental Status: He is alert and oriented to person, place, and time.  Psychiatric:        Mood and Affect: Mood normal.        Behavior: Behavior normal.        Thought Content: Thought content normal.        Judgment: Judgment normal.         Assessment And Plan:     1. Essential hypertension  Repeat blood pressure was 142/110 on right arm  Will start HCTZ 12.5 mg daily  Discussed importance of increasing water and potassium intake.   - hydrochlorothiazide (HYDRODIURIL) 12.5 MG tablet; Take 1 tablet (12.5 mg total) by mouth daily.  Dispense: 30 tablet; Refill: 1  2. OSA (obstructive sleep apnea)  Reports feel rested after using for at least 7 hours  Continue follow up with Dr. Canary Brim, FNP    THE PATIENT IS ENCOURAGED TO PRACTICE SOCIAL DISTANCING DUE TO THE COVID-19 PANDEMIC.

## 2018-09-23 NOTE — Patient Instructions (Signed)
Keep a paper or log of blood pressure on your phone for the next visit  Increase water and potassium intake.   Potassium Content of Foods  Potassium is a mineral found in many foods and drinks. It affects how the heart works, and helps keep fluids and minerals balanced in the body. The amount of potassium you need each day depends on your age and any medical conditions you may have. Talk to your health care provider or dietitian about how much potassium you need. The following lists of foods provide the general serving size for foods and the approximate amount of potassium in each serving, listed in milligrams (mg). Actual values may vary depending on the product and how it is processed. High in potassium The following foods and beverages have 200 mg or more of potassium per serving:  Apricots (raw) - 2 have 200 mg of potassium.  Apricots (dry) - 5 have 200 mg of potassium.  Artichoke - 1 medium has 345 mg of potassium.  Avocado -  fruit has 245 mg of potassium.  Banana - 1 medium fruit has 425 mg of potassium.  Lima or baked beans (canned) -  cup has 280 mg of potassium.  White beans (canned) -  cup has 595 mg potassium.  Beef roast - 3 oz has 320 mg of potassium.  Ground beef - 3 oz has 270 mg of potassium.  Beets (raw or cooked) -  cup has 260 mg of potassium.  Bran muffin - 2 oz has 300 mg of potassium.  Broccoli (cooked) -  cup has 230 mg of potassium.  Brussels sprouts -  cup has 250 mg of potassium.  Cantaloupe -  cup has 215 mg of potassium.  Cereal, 100% bran -  cup has 200-400 mg of potassium.  Cheeseburger -1 single fast food burger has 225-400 mg of potassium.  Chicken - 3 oz has 220 mg of potassium.  Clams (canned) - 3 oz has 535 mg of potassium.  Crab - 3 oz has 225 mg of potassium.  Dates - 5 have 270 mg of potassium.  Dried beans and peas -  cup has 300-475 mg of potassium.  Figs (dried) - 2 have 260 mg of potassium.  Fish (halibut,  tuna, cod, snapper) - 3 oz has 480 mg of potassium.  Fish (salmon, haddock, swordfish, perch) - 3 oz has 300 mg of potassium.  Fish (tuna, canned) - 3 oz has 200 mg of potassium.  Jamaica[place] fries (fast food) - 3 oz has 470 mg of potassium.  Granola with fruit and nuts -  cup has 200 mg of potassium.  Grapefruit juice -  cup has 200 mg of potassium.  Honeydew melon -  cup has 200 mg of potassium.  Kale (raw) - 1 cup has 300 mg of potassium.  Kiwi - 1 medium fruit has 240 mg of potassium.  Kohlrabi, rutabaga, parsnips -  cup has 280 mg of potassium.  Lentils -  cup has 365 mg of potassium.  Mango - 1 each has 325 mg of potassium.  Milk (nonfat, low-fat, whole, buttermilk) - 1 cup has 350-380 mg of potassium.  Milk (chocolate) - 1 cup has 420 mg of potassium  Molasses - 1 Tbsp has 295 mg of potassium.  Mushrooms -  cup has 280 mg of potassium.  Nectarine - 1 each has 275 mg of potassium.  Nuts (almonds, peanuts, hazelnuts, [place], cashew, mixed) - 1 oz has 200 mg of potassium.  Nuts (pistachios) - 1 oz has 295 mg of potassium.  Orange - 1 fruit has 240 mg of potassium.  Orange juice -  cup has 235 mg of potassium.  Papaya -  medium fruit has 390 mg of potassium.  Peanut butter (chunky) - 2 Tbsp has 240 mg of potassium.  Peanut butter (smooth) - 2 Tbsp has 210 mg of potassium.  Pear - 1 medium (200 mg) of potassium.  Pomegranate - 1 whole fruit has 400 mg of potassium.  Pomegranate juice -  cup has 215 mg of potassium.  Pork - 3 oz has 350 mg of potassium.  Potato chips (salted) - 1 oz has 465 mg of potassium.  Potato (baked with skin) - 1 medium has 925 mg of potassium.  Potato (boiled) -  cup has 255 mg of potassium.  Potato (Mashed) -  cup has 330 mg of potassium.  Prune juice -  cup has 370 mg of potassium.  Prunes - 5 have 305 mg of potassium.  Pudding (chocolate) -  cup has 230 mg of potassium.  Pumpkin (canned) -  cup has 250 mg of  potassium.  Raisins (seedless) -  cup has 270 mg of potassium.  Seeds (sunflower or pumpkin) - 1 oz has 240 mg of potassium.  Soy milk - 1 cup has 300 mg of potassium.  Spinach (cooked) - 1/2 cup has 420 mg of potassium.  Spinach (canned) -  cup has 370 mg of potassium.  Sweet potato (baked with skin) - 1 medium has 450 mg of potassium.  Swiss chard -  cup has 480 mg of potassium.  Tomato or vegetable juice -  cup has 275 mg of potassium.  Tomato (sauce or puree) -  cup has 400-550 mg of potassium.  Tomato (raw) - 1 medium has 290 mg of potassium.  Tomato (canned) -  cup has 200-300 mg of potassium.  [place]key - 3 oz has 250 mg of potassium.  Wheat germ - 1 oz has 250 mg of potassium.  Winter squash -  cup has 250 mg of potassium.  Yogurt (plain or fruited) - 6 oz has 260-435 mg of potassium.  Zucchini -  cup has 220 mg of potassium. Moderate in potassium The following foods and beverages have 50-200 mg of potassium per serving:  Apple - 1 fruit has 150 mg of potassium  Apple juice -  cup has 150 mg of potassium  Applesauce -  cup has 90 mg of potassium  Apricot nectar -  cup has 140 mg of potassium  Asparagus (small spears) -  cup has 155 mg of potassium  Asparagus (large spears) - 6 have 155 mg of potassium  Bagel (cinnamon raisin) - 1 four-inch bagel has 130 mg of potassium  Bagel (egg or plain) - 1 four- inch bagel has 70 mg of potassium  Beans (green) -  cup has 90 mg of potassium  Beans (yellow) -  cup has 190 mg of potassium  Beer, regular - 12 oz has 100 mg of potassium  Beets (canned) -  cup has 125 mg of potassium  Blackberries -  cup has 115 mg of potassium  Blueberries -  cup has 60 mg of potassium  Bread (whole wheat) - 1 slice has 70 mg of potassium  Broccoli (raw) -  cup has 145 mg of potassium  Cabbage -  cup has 150 mg of potassium  Carrots (cooked or raw) -  cup has 180 mg of potassium  Cauliflower (raw) -   cup has 150 mg of potassium  Celery (raw) -  cup has 155 mg of potassium  Cereal, bran flakes -  cup has 120-150 mg of potassium  Cheese (cottage) -  cup has 110 mg of potassium  Cherries - 10 have 150 mg of potassium  Chocolate - 1 oz bar has 165 mg of potassium  Coffee (brewed) - 6 oz has 90 mg of potassium  Corn -  cup or 1 ear has 195 mg of potassium  Cucumbers -  cup has 80 mg of potassium  Egg - 1 large egg has 60 mg of potassium  Eggplant -  cup has 60 mg of potassium  Endive (raw) -  cup has 80 mg of potassium  English muffin - 1 has 65 mg of potassium  Fish (ocean perch) - 3 oz has 192 mg of potassium  Frankfurter, beef or pork - 1 has 75 mg of potassium  Fruit cocktail -  cup has 115 mg of potassium  Grape juice -  cup has 170 mg of potassium  Grapefruit -  fruit has 175 mg of potassium  Grapes -  cup has 155 mg of potassium  Greens: kale, turnip, collard -  cup has 110-150 mg of potassium  Ice cream or frozen yogurt (chocolate) -  cup has 175 mg of potassium  Ice cream or frozen yogurt (vanilla) -  cup has 120-150 mg of potassium  Lemons, limes - 1 each has 80 mg of potassium  Lettuce - 1 cup has 100 mg of potassium  Mixed vegetables -  cup has 150 mg of potassium  Mushrooms, raw -  cup has 110 mg of potassium  Nuts (walnuts, pecans, or macadamia) - 1 oz has 125 mg of potassium  Oatmeal -  cup has 80 mg of potassium  Okra -  cup has 110 mg of potassium  Onions -  cup has 120 mg of potassium  Peach - 1 has 185 mg of potassium  Peaches (canned) -  cup has 120 mg of potassium  Pears (canned) -  cup has 120 mg of potassium  Peas, green (frozen) -  cup has 90 mg of potassium  Peppers (Green) -  cup has 130 mg of potassium  Peppers (Red) -  cup has 160 mg of potassium  Pineapple juice -  cup has 165 mg of potassium  Pineapple (fresh or canned) -  cup has 100 mg of potassium  Plums - 1 has 105 mg of potassium   Pudding, vanilla -  cup has 150 mg of potassium  Raspberries -  cup has 90 mg of potassium  Rhubarb -  cup has 115 mg of potassium  Rice, wild -  cup has 80 mg of potassium  Shrimp - 3 oz has 155 mg of potassium  Spinach (raw) - 1 cup has 170 mg of potassium  Strawberries -  cup has 125 mg of potassium  Summer squash -  cup has 175-200 mg of potassium  Swiss chard (raw) - 1 cup has 135 mg of potassium  Tangerines - 1 fruit has 140 mg of potassium  Tea, brewed - 6 oz has 65 mg of potassium  Turnips -  cup has 140 mg of potassium  Watermelon -  cup has 85 mg of potassium  Wine (Red, table) - 5 oz has 180 mg of potassium  Wine (White, table) - 5 oz 100 mg of potassium Low in potassium The  following foods and beverages have less than 50 mg of potassium per serving.  Bread (white) - 1 slice has 30 mg of potassium  Carbonated beverages - 12 oz has less than 5 mg of potassium  Cheese - 1 oz has 20-30 mg of potassium  Cranberries -  cup has 45 mg of potassium  Cranberry juice cocktail -  cup has 20 mg of potassium  Fats and oils - 1 Tbsp has less than 5 mg of potassium  Hummus - 1 Tbsp has 32 mg of potassium  Nectar (papaya, mango, or pear) -  cup has 35 mg of potassium  Rice (white or brown) -  cup has 50 mg of potassium  Spaghetti or macaroni (cooked) -  cup has 30 mg of potassium  Tortilla, flour or corn - 1 has 50 mg of potassium  Waffle - 1 four-inch waffle has 50 mg of potassium  Water chestnuts -  cup has 40 mg of potassium Summary  Potassium is a mineral found in many foods and drinks. It affects how the heart works, and helps keep fluids and minerals balanced in the body.  The amount of potassium you need each day depends on your age and any existing medical conditions you may have. Your health care provider or dietitian may recommend an amount of potassium that you should have each day. This information is not intended to replace advice  given to you by your health care provider. Make sure you discuss any questions you have with your health care provider. Document Released: 11/08/2004 Document Revised: 06/21/2016 Document Reviewed: 06/21/2016 Elsevier Interactive Patient Education  2019 [employer]

## 2018-09-28 DIAGNOSIS — G4733 Obstructive sleep apnea (adult) (pediatric): Secondary | ICD-10-CM | POA: Diagnosis not present

## 2018-10-15 ENCOUNTER — Other Ambulatory Visit: Payer: Self-pay | Admitting: Nurse Practitioner

## 2018-10-15 DIAGNOSIS — I10 Essential (primary) hypertension: Secondary | ICD-10-CM

## 2018-10-24 ENCOUNTER — Ambulatory Visit: Payer: BC Managed Care – PPO | Admitting: Internal Medicine

## 2018-10-24 ENCOUNTER — Encounter: Payer: Self-pay | Admitting: Internal Medicine

## 2018-10-24 ENCOUNTER — Other Ambulatory Visit: Payer: Self-pay

## 2018-10-24 VITALS — BP 138/78 | HR 78 | Temp 98.3°F | Ht 68.6 in | Wt 228.8 lb

## 2018-10-24 DIAGNOSIS — I10 Essential (primary) hypertension: Secondary | ICD-10-CM

## 2018-10-24 LAB — CMP14 + ANION GAP
ALT: 29 IU/L (ref 0–44)
AST: 19 IU/L (ref 0–40)
Albumin/Globulin Ratio: 1.6 (ref 1.2–2.2)
Albumin: 4.3 g/dL (ref 4.0–5.0)
Alkaline Phosphatase: 85 IU/L (ref 39–117)
Anion Gap: 15 mmol/L (ref 10.0–18.0)
BUN/Creatinine Ratio: 14 (ref 9–20)
BUN: 16 mg/dL (ref 6–20)
Bilirubin Total: 0.6 mg/dL (ref 0.0–1.2)
CO2: 23 mmol/L (ref 20–29)
Calcium: 9.3 mg/dL (ref 8.7–10.2)
Chloride: 102 mmol/L (ref 96–106)
Creatinine, Ser: 1.17 mg/dL (ref 0.76–1.27)
GFR calc Af Amer: 92 mL/min/{1.73_m2} (ref >59)
GFR calc non Af Amer: 79 mL/min/{1.73_m2} (ref >59)
Globulin, Total: 2.7 g/dL (ref 1.5–4.5)
Glucose: 91 mg/dL (ref 65–99)
Potassium: 4.1 mmol/L (ref 3.5–5.2)
Sodium: 140 mmol/L (ref 134–144)
Total Protein: 7 g/dL (ref 6.0–8.5)

## 2018-10-24 MED ORDER — CETIRIZINE HCL 10 MG PO TABS: 10.0000 mg | ORAL_TABLET | Freq: Every day | ORAL | 0 refills | Status: DC

## 2018-10-24 NOTE — Patient Instructions (Signed)
GET FLONASE WHICH IS OVER THE COUNTER, OK TO GET GENERIC CALLED FLUTICASONE AND USE 2 SPRAYS EACH NOSTRIL ONCE A DAY FOR STUFFINESS.

## 2018-10-24 NOTE — Progress Notes (Signed)
  Subjective:     Patient ID: Erik West , male    DOB: 08/11/1980 , 38 y.o.   MRN: [id number]   Chief Complaint  Patient presents with  . Hypertension    HPI  Pt is here for HTN FU, has been checking it, and most of his readings after he relaxes from work have been 120-'s for upper 962'I systolic, and A few times 297/989 systolic. Has been tolerating the HCTZ fine.  Since using C-pap he has not been falling asleep during  The day like he used to.   Past Medical History:  Diagnosis Date  . Essential hypertension   . Hypersomnia      Family History  Problem Relation Age of Onset  . Hypertension Paternal Grandmother   . Cancer Other      Current Outpatient Medications:  .  fexofenadine (ALLEGRA) 180 MG tablet, TAKE 1 TABLET BY MOUTH EVERY DAY, Disp: 90 tablet, Rfl: 2 .  hydrochlorothiazide (HYDRODIURIL) 12.5 MG tablet, TAKE 1 TABLET BY MOUTH EVERY DAY, Disp: 30 tablet, Rfl: 1 .  lisinopril (ZESTRIL) 10 MG tablet, TAKE 1 TABLET BY MOUTH EVERY DAY, Disp: 90 tablet, Rfl: 0   No Known Allergies   Review of Systems  Nose has been stuffy today. Denies CP, SOB, edema, abdominal pain, sweating, HA's, dizziness, hypersomnia.  Today's Vitals   10/24/18 0956  BP: 138/78  Pulse: 78  Temp: 98.3 F (36.8 C)  TempSrc: Oral  SpO2: 95%  Weight: 228 lb 12.8 oz (103.8 kg)  Height: 5' 8.6" (1.742 m)   Body mass index is 34.18 kg/m.   Objective:  Physical Exam  Constitutional: She is oriented to person, place, and time. She appears well-developed and well-nourished. No distress. This is the first time since I met him, he did not fall asleep on me.  HENT:  Head: Normocephalic and atraumatic.  Right Ear: External ear normal.  Left Ear: External ear normal.  Nose: Nose normal.  Eyes: Conjunctivae are normal. Right eye exhibits no discharge. Left eye exhibits no discharge. No scleral icterus.  Neck: Neck supple. No thyromegaly present.  No carotid bruits bilaterally  Cardiovascular:  Normal rate and regular rhythm.  No murmur heard. Pulmonary/Chest: Effort normal and breath sounds normal. No respiratory distress.  Musculoskeletal: Normal range of motion. She exhibits no edema.  Lymphadenopathy:    She has no cervical adenopathy.  Neurological: She is alert and oriented to person, place, and time.  Skin: Skin is warm and dry. Capillary refill takes less than 2 seconds. No rash noted. She is not diaphoretic.  Psychiatric: She has a normal mood and affect. Her behavior is normal. Judgment and thought content normal.  Nursing note reviewed.  Assessment And Plan:     1. Essential hypertension- improved. Fu IN 3 MONTHS.   - CMP14 + Anion Gap  May continue same medication and keep working on wt loss.   2- Allergies- I changed his Allegra to Zyrtec.    Tonia Avino RODRIGUEZ-SOUTHWORTH, PA-C    THE PATIENT IS ENCOURAGED TO PRACTICE SOCIAL DISTANCING DUE TO THE COVID-19 PANDEMIC.

## 2018-10-28 DIAGNOSIS — G4733 Obstructive sleep apnea (adult) (pediatric): Secondary | ICD-10-CM | POA: Diagnosis not present

## 2018-10-29 ENCOUNTER — Ambulatory Visit: Payer: BLUE CROSS/BLUE SHIELD | Admitting: Neurology

## 2018-11-11 ENCOUNTER — Encounter: Payer: Self-pay | Admitting: Neurology

## 2018-11-11 ENCOUNTER — Ambulatory Visit (INDEPENDENT_AMBULATORY_CARE_PROVIDER_SITE_OTHER): Payer: BC Managed Care – PPO | Admitting: Neurology

## 2018-11-11 ENCOUNTER — Other Ambulatory Visit: Payer: Self-pay

## 2018-11-11 ENCOUNTER — Telehealth: Payer: Self-pay

## 2018-11-11 VITALS — BP 142/88 | HR 109 | Ht 71.0 in | Wt 230.0 lb

## 2018-11-11 DIAGNOSIS — G4734 Idiopathic sleep related nonobstructive alveolar hypoventilation: Secondary | ICD-10-CM

## 2018-11-11 DIAGNOSIS — Z789 Other specified health status: Secondary | ICD-10-CM | POA: Diagnosis not present

## 2018-11-11 DIAGNOSIS — R0981 Nasal congestion: Secondary | ICD-10-CM | POA: Diagnosis not present

## 2018-11-11 DIAGNOSIS — G4733 Obstructive sleep apnea (adult) (pediatric): Secondary | ICD-10-CM

## 2018-11-11 DIAGNOSIS — Z9989 Dependence on other enabling machines and devices: Secondary | ICD-10-CM
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
## 2018-11-11 NOTE — Telephone Encounter (Signed)
Received this notice from [facility] regarding the status of pt's ONO:      "The only order we received was in April and the patient cancelled as he was not wanting the testing at that time."

## 2018-11-11 NOTE — Patient Instructions (Signed)
I would like for you to return to the sleep lab for a CPAP titration study, during which we will monitor your sleep and adjust your treatment with the CPAP. I have placed the order in your chart. The sleep lab will call you to set up your sleep study. You have severe sleep apnea and while using autoPAP has reduced your sleep apnea, we can optimize treatment settings a little more, please continue to try to be fully compliant with your AutoPap, do not skip any days. Please remember that untreated obstructive sleep apnea when it is moderate to severe can have an adverse impact on cardiovascular health and raise her risk for heart disease, arrhythmias, hypertension, congestive heart failure, stroke and diabetes. Untreated obstructive sleep apnea causes sleep disruption, nonrestorative sleep, and sleep deprivation. This can have an impact on your day to day functioning and cause daytime sleepiness and impairment of cognitive function, memory loss, mood disturbance, and problems focussing. Using CPAP regularly can improve these symptoms.

## 2018-11-20 ENCOUNTER — Other Ambulatory Visit: Payer: Self-pay | Admitting: Internal Medicine

## 2018-11-20 DIAGNOSIS — I10 Essential (primary) hypertension: Secondary | ICD-10-CM

## 2018-11-28 DIAGNOSIS — G4733 Obstructive sleep apnea (adult) (pediatric): Secondary | ICD-10-CM | POA: Diagnosis not present

## 2018-12-12 ENCOUNTER — Encounter: Payer: Self-pay | Admitting: Internal Medicine

## 2018-12-18 ENCOUNTER — Other Ambulatory Visit: Payer: Self-pay | Admitting: Internal Medicine

## 2018-12-18 DIAGNOSIS — I10 Essential (primary) hypertension: Secondary | ICD-10-CM

## 2018-12-19 ENCOUNTER — Encounter: Payer: Self-pay | Admitting: Internal Medicine

## 2018-12-19 ENCOUNTER — Other Ambulatory Visit: Payer: Self-pay

## 2018-12-19 ENCOUNTER — Ambulatory Visit: Payer: BC Managed Care – PPO | Admitting: Internal Medicine

## 2018-12-19 VITALS — BP 140/82 | HR 78 | Temp 98.4°F | Ht 71.0 in | Wt 228.0 lb

## 2018-12-19 DIAGNOSIS — M546 Pain in thoracic spine: Secondary | ICD-10-CM

## 2018-12-19 DIAGNOSIS — M542 Cervicalgia: Secondary | ICD-10-CM
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
## 2018-12-25 ENCOUNTER — Encounter: Payer: Self-pay | Admitting: Family Medicine

## 2018-12-25 ENCOUNTER — Ambulatory Visit (INDEPENDENT_AMBULATORY_CARE_PROVIDER_SITE_OTHER): Payer: BC Managed Care – PPO | Admitting: Family Medicine

## 2018-12-25 DIAGNOSIS — M542 Cervicalgia: Secondary | ICD-10-CM

## 2018-12-25 MED ORDER — VITAMIN D-3 125 MCG (5000 UT) PO TABS: 1.0000 | ORAL_TABLET | Freq: Every day | ORAL | 3 refills | Status: DC

## 2018-12-25 MED ORDER — DICLOFENAC SODIUM 1 % TD GEL: 4.0000 g | Freq: Four times a day (QID) | TRANSDERMAL | 6 refills | Status: DC | PRN
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
## 2018-12-29 DIAGNOSIS — G4733 Obstructive sleep apnea (adult) (pediatric): Secondary | ICD-10-CM | POA: Diagnosis not present

## 2019-01-07 ENCOUNTER — Other Ambulatory Visit (HOSPITAL_COMMUNITY)
Admission: RE | Admit: 2019-01-07 | Discharge: 2019-01-07 | Disposition: A | Discharge status: Home / Self Care | Payer: BC Managed Care – PPO | Source: Ambulatory Visit | Attending: Neurology | Admitting: Neurology

## 2019-01-07 DIAGNOSIS — Z20828 Contact with and (suspected) exposure to other viral communicable diseases: Secondary | ICD-10-CM | POA: Insufficient documentation

## 2019-01-07 DIAGNOSIS — Z01812 Encounter for preprocedural laboratory examination: Secondary | ICD-10-CM | POA: Insufficient documentation

## 2019-01-08 LAB — NOVEL CORONAVIRUS, NAA (HOSP ORDER, SEND-OUT TO REF LAB; TAT 18-24 HRS): SARS-CoV-2, NAA: NOT DETECTED

## 2019-01-10 ENCOUNTER — Ambulatory Visit (INDEPENDENT_AMBULATORY_CARE_PROVIDER_SITE_OTHER): Payer: BC Managed Care – PPO | Admitting: Neurology

## 2019-01-10 ENCOUNTER — Other Ambulatory Visit: Payer: Self-pay

## 2019-01-10 DIAGNOSIS — R0981 Nasal congestion: Secondary | ICD-10-CM

## 2019-01-10 DIAGNOSIS — Z9989 Dependence on other enabling machines and devices: Secondary | ICD-10-CM

## 2019-01-10 DIAGNOSIS — G4733 Obstructive sleep apnea (adult) (pediatric): Secondary | ICD-10-CM | POA: Diagnosis not present

## 2019-01-10 DIAGNOSIS — G4734 Idiopathic sleep related nonobstructive alveolar hypoventilation: Secondary | ICD-10-CM

## 2019-01-10 DIAGNOSIS — Z789 Other specified health status: Secondary | ICD-10-CM

## 2019-01-18 ENCOUNTER — Ambulatory Visit
Admission: RE | Admit: 2019-01-18 | Discharge: 2019-01-18 | Disposition: A | Discharge status: Home / Self Care | Payer: BC Managed Care – PPO | Source: Ambulatory Visit | Attending: Family Medicine | Admitting: Family Medicine

## 2019-01-18 ENCOUNTER — Other Ambulatory Visit: Payer: Self-pay

## 2019-01-18 DIAGNOSIS — M4802 Spinal stenosis, cervical region: Secondary | ICD-10-CM | POA: Diagnosis not present

## 2019-01-18 DIAGNOSIS — M542 Cervicalgia: Secondary | ICD-10-CM

## 2019-01-19 ENCOUNTER — Telehealth: Payer: Self-pay | Admitting: Family Medicine

## 2019-01-19 ENCOUNTER — Other Ambulatory Visit: Payer: Self-pay | Admitting: Internal Medicine

## 2019-01-19 NOTE — Telephone Encounter (Signed)
MRI shows:  The C5-6 disc bulges and pushes against the spinal cord.  The C6-7 disc does the same thing.  The C4-5 disc does it to a lesser degree.  Treatment options include:  - Resuming physical therapy.  - Referral for an epidural steroid injection (cortisone).  - Referral for surgical consult.

## 2019-01-20 ENCOUNTER — Telehealth: Payer: Self-pay

## 2019-01-20 NOTE — Telephone Encounter (Signed)
-----   Message from Star Age, MD sent at 01/20/2019  8:33 AM EDT ----- Patient referred by his primary care PA, seen by me on 04/24/18, HST on 05/14/18. Has been on autoPAP. Patient had a CPAP titration study on 01/10/19.  Please call and inform patient that I have entered an order for treatment with positive airway pressure (PAP) treatment for obstructive sleep apnea (OSA). He did well during the latest sleep study with BiPAP. We will, therefore, arrange for a machine for home use through his DME. We will have to see the patient back in follow-up in about 10 weeks. Please also explain to the patient that I will be looking out for compliance data, which can be downloaded from the machine (stored on an SD card, that is inserted in the machine) or via remote access through a modem, that is built into the machine. At the time of the followup appointment we will discuss sleep study results and how it is going with PAP treatment at home. Please advise patient to bring His machine at the time of the first FU visit, even though this is cumbersome. Bringing the machine for every visit after that will likely not be needed, but often helps for the first visit to troubleshoot if needed. Please re-enforce the importance of compliance with treatment and the need for [place] to monitor compliance data - often an insurance requirement and actually good feedback for the patient as far as how they are doing.  Also remind patient, that any interim PAP machine or mask issues should be first addressed with the DME company, as they can often help better with technical and mask fit issues. Please ask if patient has a preference regarding DME company.  Please also make sure, the patient has a follow-up appointment with me in about 10 weeks from the setup date, thanks. May see one of our nurse practitioners if needed for proper timing of the FU appointment.  Please fax or rout report to the referring provider. Thanks,   Star Age, MD,  PhD [facility])

## 2019-01-20 NOTE — Progress Notes (Signed)
Patient referred by his primary care PA, seen by me on 04/24/18, HST on 05/14/18. Has been on autoPAP. Patient had a CPAP titration study on 01/10/19.  Please call and inform patient that I have entered an order for treatment with positive airway pressure (PAP) treatment for obstructive sleep apnea (OSA). He did well during the latest sleep study with BiPAP. We will, therefore, arrange for a machine for home use through his DME. We will have to see the patient back in follow-up in about 10 weeks. Please also explain to the patient that I will be looking out for compliance data, which can be downloaded from the machine (stored on an SD card, that is inserted in the machine) or via remote access through a modem, that is built into the machine. At the time of the followup appointment we will discuss sleep study results and how it is going with PAP treatment at home. Please advise patient to bring His machine at the time of the first FU visit, even though this is cumbersome. Bringing the machine for every visit after that will likely not be needed, but often helps for the first visit to troubleshoot if needed. Please re-enforce the importance of compliance with treatment and the need for [place] to monitor compliance data - often an insurance requirement and actually good feedback for the patient as far as how they are doing.  Also remind patient, that any interim PAP machine or mask issues should be first addressed with the DME company, as they can often help better with technical and mask fit issues. Please ask if patient has a preference regarding DME company.  Please also make sure, the patient has a follow-up appointment with me in about 10 weeks from the setup date, thanks. May see one of our nurse practitioners if needed for proper timing of the FU appointment.  Please fax or rout report to the referring provider. Thanks,   Star Age, MD, PhD [facility])

## 2019-01-20 NOTE — Addendum Note (Signed)
Addended by: Star Age on: 01/20/2019 08:33 AM   Modules accepted: Orders

## 2019-01-20 NOTE — Procedures (Signed)
S PATIENT'S NAME:  Erik West, Erik West DOB:      1980/05/10      MR#:    [id number]     DATE OF RECORDING: 01/10/2019 REFERRING M.D.:  Shelby Mattocks, PA Study Performed:   CPAP  Titration HISTORY: 38 year old man with a history of seasonal allergies, tonsillar hypertrophy, hypertension and obesity, who presents for a full night titration study. He has been on AutoPap therapy with suboptimal control of his OSA. The patient endorsed the Epworth Sleepiness Scale at 16 points. The patient's weight 230 pounds with a height of 71 (inches), resulting in a BMI of 32.1 kg/m2. The patient's neck circumference measured 18 inches.  CURRENT MEDICATIONS: Zyrtec, Hydrodiuril, Lisinopril   PROCEDURE:  This is a multichannel digital polysomnogram utilizing the SomnoStar 11.2 system.  Electrodes and sensors were applied and monitored per AASM Specifications.   EEG, EOG, Chin and Limb EMG, were sampled at 200 Hz.  ECG, Snore and Nasal Pressure, Thermal Airflow, Respiratory Effort, CPAP Flow and Pressure, Oximetry was sampled at 50 Hz. Digital video and audio were recorded.      The patient was fitted with a large F&P Vitera FFM. CPAP was initiated at 6 cmH20 with heated humidity per AASM standards and pressure was advanced to 16 cmH20 because of hypopneas, apneas and desaturations and he had residual snoring. He was switched to standard BiPAP, due to higher pressure requirement. He was titrated from 17/13 cm to 19/15 cm. At the final pressure, his AHI was 1/hour with brief supine sleep achieved and O2 nadir of 93%. At a PAP pressure of 18/14 cmH20, there was a reduction of the AHI to 2.7/hour with supine REM sleep achieved and O2 nadir of 88%.   Lights Out was at 20:46 and Lights On at 04:32. Total recording time (TRT) was 466.5 minutes, with a total sleep time (TST) of 414 minutes. The patient's sleep latency was 35 minutes, REM latency was 67 minutes, which is slightly reduced. The sleep efficiency was 88.7 %.     SLEEP ARCHITECTURE: WASO (Wake after sleep onset) was 17.5 minutes.  There were 15 minutes in Stage N1, 302.5 minutes Stage N2, 25.5 minutes Stage N3 and 71 minutes in Stage REM.  The percentage of Stage N1 was 3.6%, Stage N2 was 73.1%, which is elevated, Stage N3 was 6.2% and Stage R (REM sleep) was 17.1%, which is mildly reduced. The arousals were noted as: 42 were spontaneous, 0 were associated with PLMs, 4 were associated with respiratory events.  RESPIRATORY ANALYSIS:  There was a total of 23 respiratory events: 0 obstructive apneas, 0 central apneas and 0 mixed apneas with a total of 0 apneas and an apnea index (AI) of 0 /hour. There were 23 hypopneas with a hypopnea index of 3.3/hour. The patient also had 0 respiratory event related arousals (RERAs).      The total APNEA/HYPOPNEA INDEX  (AHI) was 3.3 /hour and the total RESPIRATORY DISTURBANCE INDEX was 3.3 /hour  3 events occurred in REM sleep and 20 events in NREM. The REM AHI was 2.5 /hour versus a non-REM AHI of 3.5 /hour.  The patient spent 362 minutes of total sleep time in the supine position and 52 minutes in non-supine. The supine AHI was 3.8, versus a non-supine AHI of 0.0.  OXYGEN SATURATION & C02:  The baseline 02 saturation was 96%, with the lowest being 88%. Time spent below 89% saturation equaled 0 minutes.  PERIODIC LIMB MOVEMENTS:  The patient had a total of 0 Periodic  Limb Movements. The Periodic Limb Movement (PLM) index was 0 and the PLM Arousal index was 0 /hour.  Audio and video analysis did not show any abnormal or unusual movements, behaviors, phonations or vocalizations. The patient took 1 bathroom break. Snoring was noted, until he was on the higher BiPAP pressures. EKG was in keeping with normal sinus rhythm (NSR).  Post-study, the patient indicated that sleep was better than usual.   DIAGNOSIS 1. Obstructive Sleep Apnea   PLANS/RECOMMENDATIONS: 1. This study demonstrates significant improvement and  near-complete resolution of the patient's obstructive sleep apnea with BiPAP therapy. I will, therefore, start the patient on home BiPAP at a pressure of 19/15 cm via large FFM with heated humidity. The patient should be reminded to be fully compliant with PAP therapy to improve sleep related symptoms and decrease long term cardiovascular risks. The patient should be reminded, that it may take up to 3 months to get fully used to using PAP with all planned sleep. The earlier full compliance is achieved, the better long term compliance tends to be. Please note that untreated obstructive sleep apnea may carry additional perioperative morbidity. Patients with significant obstructive sleep apnea should receive perioperative PAP therapy and the surgeons and particularly the anesthesiologist should be informed of the diagnosis and the severity of the sleep disordered breathing. 2. The patient should be cautioned not to drive, work at heights, or operate dangerous or heavy equipment when tired or sleepy. Review and reiteration of good sleep hygiene measures should be pursued with any patient. 3. The patient will be seen in follow-up in the sleep clinic at [facility] for discussion of the test results, symptom and treatment compliance review, further management strategies, etc. The referring provider will be notified of the test results.  I certify that I have reviewed the entire raw data recording prior to the issuance of this report in accordance with the Standards of Accreditation of the American Academy of Sleep Medicine (AASM)   Huston Foley, MD,PhD Diplomat, American Board of Neurology and Sleep Medicine (Neurology and Sleep Medicine)

## 2019-01-20 NOTE — Telephone Encounter (Signed)
I called pt. I advised pt that Dr. Rexene Alberts reviewed their sleep study results and found that pt did well with a bipap during his latest sleep study. Dr. Rexene Alberts recommends that pt start a bipap at home. I reviewed PAP compliance expectations with the pt. Pt is agreeable to starting a BiPAP. I advised pt that an order will be sent to a DME, [facility], and [facility] will call the pt within about one week after they file with the pt's insurance. [facility] will show the pt how to use the machine, fit for masks, and troubleshoot the BiPAP if needed. A follow up appt was made for insurance purposes with amy, NP on 05/01/19 at 3:30pm. Pt verbalized understanding to arrive 15 minutes early and bring their BiPAP. A letter with all of this information in it will be mailed to the pt as a reminder. I verified with the pt that the address we have on file is correct. Pt verbalized understanding of results. Pt had no questions at this time but was encouraged to call back if questions arise. I have sent the order to [facility] and have received confirmation that they have received the order.

## 2019-01-21 ENCOUNTER — Telehealth: Payer: Self-pay | Admitting: Family Medicine

## 2019-01-21 NOTE — Telephone Encounter (Signed)
Patient called. He would like to know the results of his MRI. His call back number is [phone]

## 2019-01-22 NOTE — Telephone Encounter (Signed)
I called and advised the patient of the results and treatment options (he had not seen the message in MyChart yet). He said the pain is always there, but tolerable enough that he is able to work - "but I can feel a difference."  The patient is going to discuss the options with his wife and let us know how he would like to proceed.

## 2019-01-28 DIAGNOSIS — G4733 Obstructive sleep apnea (adult) (pediatric): Secondary | ICD-10-CM | POA: Diagnosis not present

## 2019-01-29 ENCOUNTER — Other Ambulatory Visit: Payer: Self-pay

## 2019-01-29 ENCOUNTER — Encounter: Payer: Self-pay | Admitting: Family Medicine

## 2019-01-29 ENCOUNTER — Ambulatory Visit (INDEPENDENT_AMBULATORY_CARE_PROVIDER_SITE_OTHER): Payer: BC Managed Care – PPO | Admitting: Family Medicine

## 2019-01-29 DIAGNOSIS — M5020 Other cervical disc displacement, unspecified cervical region: Secondary | ICD-10-CM

## 2019-01-29 DIAGNOSIS — M542 Cervicalgia: Secondary | ICD-10-CM
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
## 2019-01-30 ENCOUNTER — Ambulatory Visit: Payer: BC Managed Care – PPO | Admitting: Internal Medicine

## 2019-02-07 DIAGNOSIS — G8929 Other chronic pain: Secondary | ICD-10-CM | POA: Diagnosis not present

## 2019-02-07 DIAGNOSIS — M4802 Spinal stenosis, cervical region: Secondary | ICD-10-CM | POA: Diagnosis not present

## 2019-02-07 DIAGNOSIS — M549 Dorsalgia, unspecified: Secondary | ICD-10-CM | POA: Diagnosis not present

## 2019-02-07 DIAGNOSIS — R200 Anesthesia of skin: Secondary | ICD-10-CM | POA: Diagnosis not present

## 2019-02-10 DIAGNOSIS — M542 Cervicalgia: Secondary | ICD-10-CM | POA: Diagnosis not present

## 2019-02-12 DIAGNOSIS — M542 Cervicalgia: Secondary | ICD-10-CM | POA: Diagnosis not present

## 2019-02-17 DIAGNOSIS — M542 Cervicalgia: Secondary | ICD-10-CM | POA: Diagnosis not present

## 2019-02-19 ENCOUNTER — Ambulatory Visit: Payer: BC Managed Care – PPO | Admitting: Adult Health

## 2019-02-19 DIAGNOSIS — M542 Cervicalgia: Secondary | ICD-10-CM | POA: Diagnosis not present

## 2019-02-20 ENCOUNTER — Ambulatory Visit: Payer: BC Managed Care – PPO | Admitting: Internal Medicine

## 2019-02-24 DIAGNOSIS — M542 Cervicalgia: Secondary | ICD-10-CM | POA: Diagnosis not present

## 2019-02-26 DIAGNOSIS — M542 Cervicalgia: Secondary | ICD-10-CM | POA: Diagnosis not present

## 2019-02-28 DIAGNOSIS — G4733 Obstructive sleep apnea (adult) (pediatric): Secondary | ICD-10-CM | POA: Diagnosis not present

## 2019-03-02 ENCOUNTER — Other Ambulatory Visit: Payer: Self-pay | Admitting: Internal Medicine

## 2019-03-02 DIAGNOSIS — I10 Essential (primary) hypertension: Secondary | ICD-10-CM

## 2019-03-13 ENCOUNTER — Ambulatory Visit: Payer: BC Managed Care – PPO | Admitting: Internal Medicine

## 2019-03-18 DIAGNOSIS — R200 Anesthesia of skin: Secondary | ICD-10-CM | POA: Diagnosis not present

## 2019-03-18 DIAGNOSIS — M79601 Pain in right arm: Secondary | ICD-10-CM | POA: Diagnosis not present

## 2019-03-18 DIAGNOSIS — M4802 Spinal stenosis, cervical region: Secondary | ICD-10-CM | POA: Diagnosis not present

## 2019-03-19 ENCOUNTER — Encounter: Payer: Self-pay | Admitting: Internal Medicine

## 2019-03-20 ENCOUNTER — Ambulatory Visit: Payer: BC Managed Care – PPO | Admitting: Internal Medicine

## 2019-03-28 DIAGNOSIS — Z6832 Body mass index (BMI) 32.0-32.9, adult: Secondary | ICD-10-CM | POA: Diagnosis not present

## 2019-03-28 DIAGNOSIS — M4802 Spinal stenosis, cervical region: Secondary | ICD-10-CM | POA: Diagnosis not present

## 2019-03-28 DIAGNOSIS — R030 Elevated blood-pressure reading, without diagnosis of hypertension: Secondary | ICD-10-CM | POA: Diagnosis not present

## 2019-04-19 ENCOUNTER — Other Ambulatory Visit: Payer: Self-pay | Admitting: Internal Medicine

## 2019-05-01 ENCOUNTER — Ambulatory Visit: Payer: Self-pay | Admitting: Family Medicine

## 2019-05-19 ENCOUNTER — Telehealth: Payer: Self-pay | Admitting: Family Medicine

## 2019-05-19 DIAGNOSIS — M5020 Other cervical disc displacement, unspecified cervical region: Secondary | ICD-10-CM

## 2019-05-19 NOTE — Telephone Encounter (Signed)
Patient called.   He is requesting a referral to another specialist for his neck as he feels he needs another opinion. He was already given one referral.   Call back number: [phone]

## 2019-05-20 NOTE — Telephone Encounter (Signed)
Left message on patient's voice mail to call back - would like to get a little more information on this from the patient.

## 2019-05-22 NOTE — Telephone Encounter (Signed)
This patient has seen Dr. Danielle Dess for his neck. He would now like to see someone else for a 2nd opinion. Do you need to see him first? He has not returned my phone call to get more information about this.

## 2019-05-22 NOTE — Telephone Encounter (Signed)
I called and advised the patient of the plan. Dr. Marshell Levan office should be calling him to set up an appointment.

## 2019-05-22 NOTE — Telephone Encounter (Signed)
Second opinion requested from Dr. Yevette Edwards.

## 2019-05-22 NOTE — Addendum Note (Signed)
Addended by: Lillia Carmel on: 05/22/2019 11:03 AM   Modules accepted: Orders

## 2019-05-23 ENCOUNTER — Encounter: Payer: Self-pay | Admitting: Internal Medicine

## 2019-05-27 ENCOUNTER — Other Ambulatory Visit: Payer: Self-pay | Admitting: Internal Medicine

## 2019-05-27 DIAGNOSIS — I10 Essential (primary) hypertension: Secondary | ICD-10-CM

## 2019-05-29 ENCOUNTER — Other Ambulatory Visit: Payer: Self-pay | Admitting: Internal Medicine

## 2019-05-29 DIAGNOSIS — I10 Essential (primary) hypertension: Secondary | ICD-10-CM

## 2019-06-02 DIAGNOSIS — G959 Disease of spinal cord, unspecified: Secondary | ICD-10-CM | POA: Diagnosis not present

## 2019-06-03 ENCOUNTER — Telehealth: Payer: Self-pay

## 2019-06-03 NOTE — Telephone Encounter (Signed)
Pt LVM that he has left several messages he needs a refill on his medications.  Called pt back LVM for pt to contact the office he will need an appt, also to let pt know his provider will be in office tomorrow

## 2019-06-09 ENCOUNTER — Other Ambulatory Visit: Payer: Self-pay | Admitting: Orthopedic Surgery

## 2019-06-12 ENCOUNTER — Other Ambulatory Visit: Payer: Self-pay

## 2019-06-12 ENCOUNTER — Encounter: Payer: Self-pay | Admitting: Nurse Practitioner

## 2019-06-12 ENCOUNTER — Ambulatory Visit: Payer: BC Managed Care – PPO | Admitting: Nurse Practitioner

## 2019-06-12 VITALS — BP 116/80 | HR 89 | Temp 98.2°F | Ht 70.0 in | Wt 230.8 lb

## 2019-06-12 DIAGNOSIS — M542 Cervicalgia: Secondary | ICD-10-CM | POA: Diagnosis not present

## 2019-06-12 DIAGNOSIS — I10 Essential (primary) hypertension: Secondary | ICD-10-CM | POA: Diagnosis not present

## 2019-06-12 DIAGNOSIS — Z23 Encounter for immunization: Secondary | ICD-10-CM | POA: Diagnosis not present

## 2019-06-12 DIAGNOSIS — Z01818 Encounter for other preprocedural examination: Secondary | ICD-10-CM | POA: Diagnosis not present

## 2019-06-12 MED ORDER — TETANUS-DIPHTH-ACELL PERTUSSIS 5-2.5-18.5 LF-MCG/0.5 IM SUSP
0.5000 mL | Freq: Once | INTRAMUSCULAR | Status: AC
  Administered 2019-06-12: 0.5 mL via INTRAMUSCULAR

## 2019-06-12 MED ORDER — HYDROCHLOROTHIAZIDE 12.5 MG PO TABS: 12.5000 mg | ORAL_TABLET | Freq: Every day | ORAL | 1 refills | Status: DC

## 2019-06-12 MED ORDER — LISINOPRIL 10 MG PO TABS: 10.0000 mg | ORAL_TABLET | Freq: Every day | ORAL | 1 refills | Status: DC

## 2019-06-12 NOTE — Progress Notes (Addendum)
This visit occurred during the SARS-CoV-2 public health emergency.  Safety protocols were in place, including screening questions prior to the visit, additional usage of staff PPE, and extensive cleaning of exam room while observing appropriate contact time as indicated for disinfecting solutions.  Subjective:     Patient ID: Erik West , male    DOB: May 13, 1980 , 39 y.o.   MRN: [id number]   Chief Complaint  Patient presents with  . Hypertension    HPI  He is scheduled to have surgery on March 25th to his neck there are 4 discs that are affecting causing pain to the nerve.  He had been having back pain and seen the Neurologist who gave him Tramadol and muscle relaxer  Wt Readings from Last 3 Encounters: 06/12/19 : 230 lb 12.8 oz (104.7 kg) 12/19/18 : 228 lb (103.4 kg) 11/11/18 : 230 lb (104.3 kg)   Hypertension This is a chronic problem. The current episode started more than 1 year ago. The problem has been gradually improving since onset. The problem is controlled. Associated symptoms include neck pain (scheduled for surgery on March 25th). Pertinent negatives include no anxiety, chest pain, headaches or palpitations.     Past Medical History:  Diagnosis Date  . Essential hypertension   . Hypersomnia      Family History  Problem Relation Age of Onset  . Hypertension Paternal Grandmother   . Cancer Other      Current Outpatient Medications:  .  cetirizine (ZYRTEC) 10 MG tablet, TAKE 1 TABLET BY MOUTH EVERY DAY, Disp: 90 tablet, Rfl: 0 .  Cholecalciferol (VITAMIN D-3) 125 MCG (5000 UT) TABS, Take 1 tablet by mouth daily., Disp: 90 tablet, Rfl: 3 .  hydrochlorothiazide (HYDRODIURIL) 12.5 MG tablet, TAKE 1 TABLET BY MOUTH EVERY DAY, Disp: 90 tablet, Rfl: 1 .  lisinopril (ZESTRIL) 10 MG tablet, TAKE 1 TABLET BY MOUTH EVERY DAY, Disp: 30 tablet, Rfl: 0 .  diclofenac sodium (VOLTAREN) 1 % GEL, Apply 4 g topically 4 (four) times daily as needed. (Patient not taking: Reported on  06/12/2019), Disp: 500 g, Rfl: 6   No Known Allergies   Review of Systems  Constitutional: Negative.   Respiratory: Negative.   Cardiovascular: Negative.  Negative for chest pain, palpitations and leg swelling.  Musculoskeletal: Positive for neck pain (scheduled for surgery on March 25th).  Neurological: Negative for dizziness and headaches.  Psychiatric/Behavioral: Negative.      Today's Vitals   06/12/19 1601  BP: 116/80  Pulse: 89  Temp: 98.2 F (36.8 C)  TempSrc: Oral  Weight: 230 lb 12.8 oz (104.7 kg)  Height: 5\' 10"  (1.778 m)  PainSc: 6   PainLoc: Shoulder   Body mass index is 33.12 kg/m.   Objective:  Physical Exam Constitutional:      General: He is not in acute distress.    Appearance: Normal appearance. He is obese.  Cardiovascular:     Rate and Rhythm: Normal rate and regular rhythm.     Pulses: Normal pulses.     Heart sounds: Normal heart sounds. No murmur.  Pulmonary:     Effort: Pulmonary effort is normal. No respiratory distress.     Breath sounds: Normal breath sounds.  Musculoskeletal:        General: No swelling or tenderness.     Comments: Range of motion limited.   Skin:    Capillary Refill: Capillary refill takes less than 2 seconds.  Neurological:     General: No focal deficit present.  Mental Status: He is alert and oriented to person, place, and time.  Psychiatric:        Mood and Affect: Mood normal.        Behavior: Behavior normal.        Thought Content: Thought content normal.        Judgment: Judgment normal.         Assessment And Plan:     1. Cervicalgia  Surgery planned for March 25th - EKG 12-Lead  2. Essential hypertension  EKG done with NSR HR 86  due to having surgery on his neck this month - lisinopril (ZESTRIL) 10 MG tablet; Take 1 tablet (10 mg total) by mouth daily.  Dispense: 90 tablet; Refill: 1 - hydrochlorothiazide (HYDRODIURIL) 12.5 MG tablet; Take 1 tablet (12.5 mg total) by mouth daily.  Dispense: 90  tablet; Refill: 1 - CMP14 + Anion Gap  3. Encounter for immunization  Will give tetanus vaccine today while in office. Refer to order management. TDAP will be administered to adults 33-53 years old every 10 years. - Tdap (BOOSTRIX) injection 0.5 mL   [name], FNP    THE PATIENT IS ENCOURAGED TO PRACTICE SOCIAL DISTANCING DUE TO THE COVID-19 PANDEMIC.

## 2019-06-13 LAB — CMP14 + ANION GAP
ALT: 23 IU/L (ref 0–44)
AST: 21 IU/L (ref 0–40)
Albumin/Globulin Ratio: 1.5 (ref 1.2–2.2)
Albumin: 4.4 g/dL (ref 4.0–5.0)
Alkaline Phosphatase: 85 IU/L (ref 39–117)
Anion Gap: 13 mmol/L (ref 10.0–18.0)
BUN/Creatinine Ratio: 10 (ref 9–20)
BUN: 12 mg/dL (ref 6–20)
Bilirubin Total: 0.5 mg/dL (ref 0.0–1.2)
CO2: 25 mmol/L (ref 20–29)
Calcium: 9.9 mg/dL (ref 8.7–10.2)
Chloride: 101 mmol/L (ref 96–106)
Creatinine, Ser: 1.24 mg/dL (ref 0.76–1.27)
GFR calc Af Amer: 85 mL/min/{1.73_m2} (ref >59)
GFR calc non Af Amer: 73 mL/min/{1.73_m2} (ref >59)
Globulin, Total: 3 g/dL (ref 1.5–4.5)
Glucose: 89 mg/dL (ref 65–99)
Potassium: 3.9 mmol/L (ref 3.5–5.2)
Sodium: 139 mmol/L (ref 134–144)
Total Protein: 7.4 g/dL (ref 6.0–8.5)

## 2019-06-30 ENCOUNTER — Encounter (HOSPITAL_COMMUNITY)
Admission: RE | Admit: 2019-06-30 | Discharge: 2019-06-30 | Disposition: A | Discharge status: Home / Self Care | Payer: BC Managed Care – PPO | Source: Ambulatory Visit | Attending: Orthopedic Surgery | Admitting: Orthopedic Surgery

## 2019-06-30 ENCOUNTER — Encounter (HOSPITAL_COMMUNITY): Payer: Self-pay

## 2019-06-30 ENCOUNTER — Other Ambulatory Visit (HOSPITAL_COMMUNITY)
Admission: RE | Admit: 2019-06-30 | Discharge: 2019-06-30 | Disposition: A | Discharge status: Home / Self Care | Payer: BC Managed Care – PPO | Source: Ambulatory Visit | Attending: Orthopedic Surgery | Admitting: Orthopedic Surgery

## 2019-06-30 ENCOUNTER — Other Ambulatory Visit: Payer: Self-pay

## 2019-06-30 DIAGNOSIS — Z20822 Contact with and (suspected) exposure to covid-19: Secondary | ICD-10-CM | POA: Insufficient documentation

## 2019-06-30 DIAGNOSIS — Z01812 Encounter for preprocedural laboratory examination: Secondary | ICD-10-CM | POA: Diagnosis not present

## 2019-06-30 DIAGNOSIS — G959 Disease of spinal cord, unspecified: Secondary | ICD-10-CM | POA: Diagnosis not present

## 2019-06-30 HISTORY — DX: Sleep apnea, unspecified: G47.30

## 2019-06-30 LAB — COMPREHENSIVE METABOLIC PANEL
ALT: 32 U/L (ref 0–44)
AST: 24 U/L (ref 15–41)
Albumin: 3.9 g/dL (ref 3.5–5.0)
Alkaline Phosphatase: 76 U/L (ref 38–126)
Anion gap: 11 (ref 5–15)
BUN: 12 mg/dL (ref 6–20)
CO2: 26 mmol/L (ref 22–32)
Calcium: 9.8 mg/dL (ref 8.9–10.3)
Chloride: 103 mmol/L (ref 98–111)
Creatinine, Ser: 1.19 mg/dL (ref 0.61–1.24)
GFR calc Af Amer: >60 mL/min (ref >60)
GFR calc non Af Amer: >60 mL/min (ref >60)
Glucose, Bld: 117 mg/dL — ABNORMAL HIGH (ref 70–99)
Potassium: 4 mmol/L (ref 3.5–5.1)
Sodium: 140 mmol/L (ref 135–145)
Total Bilirubin: 0.7 mg/dL (ref 0.3–1.2)
Total Protein: 7.5 g/dL (ref 6.5–8.1)

## 2019-06-30 LAB — CBC WITH DIFFERENTIAL/PLATELET
Abs Immature Granulocytes: 0.03 10*3/uL (ref 0.00–0.07)
Basophils Absolute: 0.1 10*3/uL (ref 0.0–0.1)
Basophils Relative: 1 %
Eosinophils Absolute: 0.4 10*3/uL (ref 0.0–0.5)
Eosinophils Relative: 5 %
HCT: 49.4 % (ref 39.0–52.0)
Hemoglobin: 15.6 g/dL (ref 13.0–17.0)
Immature Granulocytes: 0 %
Lymphocytes Relative: 29 %
Lymphs Abs: 2.3 10*3/uL (ref 0.7–4.0)
MCH: 28.6 pg (ref 26.0–34.0)
MCHC: 31.6 g/dL (ref 30.0–36.0)
MCV: 90.6 fL (ref 80.0–100.0)
Monocytes Absolute: 0.7 10*3/uL (ref 0.1–1.0)
Monocytes Relative: 9 %
Neutro Abs: 4.4 10*3/uL (ref 1.7–7.7)
Neutrophils Relative %: 56 %
Platelets: 289 10*3/uL (ref 150–400)
RBC: 5.45 MIL/uL (ref 4.22–5.81)
RDW: 13.6 % (ref 11.5–15.5)
WBC: 7.8 10*3/uL (ref 4.0–10.5)
nRBC: 0 % (ref 0.0–0.2)

## 2019-06-30 LAB — SARS CORONAVIRUS 2 (TAT 6-24 HRS): SARS Coronavirus 2: NEGATIVE

## 2019-06-30 LAB — TYPE AND SCREEN
ABO/RH(D): O POS
Antibody Screen: NEGATIVE

## 2019-06-30 LAB — PROTIME-INR
INR: 1 (ref 0.8–1.2)
Prothrombin Time: 13.3 seconds (ref 11.4–15.2)

## 2019-06-30 LAB — URINALYSIS, ROUTINE W REFLEX MICROSCOPIC
Bilirubin Urine: NEGATIVE
Glucose, UA: NEGATIVE mg/dL
Hgb urine dipstick: NEGATIVE
Ketones, ur: NEGATIVE mg/dL
Leukocytes,Ua: NEGATIVE
Nitrite: NEGATIVE
Protein, ur: NEGATIVE mg/dL
Specific Gravity, Urine: 1.018 (ref 1.005–1.030)
pH: 5 (ref 5.0–8.0)

## 2019-06-30 LAB — ABO/RH: ABO/RH(D): O POS

## 2019-06-30 LAB — APTT: aPTT: 30 seconds (ref 24–36)

## 2019-06-30 LAB — SURGICAL PCR SCREEN
MRSA, PCR: NEGATIVE
Staphylococcus aureus: NEGATIVE

## 2019-06-30 NOTE — Progress Notes (Signed)
Your procedure is scheduled on Thursday July 03, 2019.  Report to [facility] Main Entrance "A" at 05:30 A.M., and check in at the Admitting office.  Call this number if you have problems the morning of surgery: [phone]  Call [phone] if you have any questions prior to your surgery date Monday-Friday 8am-4pm   Remember: Do not eat after midnight the night before your surgery  You may drink clear liquids until 04:30 A.M. the morning of your surgery.   Clear liquids allowed are: Water, Non-Citrus Juices (without pulp), Carbonated Beverages, Clear Tea, Black Coffee Only, and Gatorade   No medicines needed the morning of surgery.   Please complete your PRE-SURGERY ENSURE that was provided to you by 04:30 A.M the morning of surgery.  Please, if able, drink it in one setting. DO NOT SIP.   As of today, STOP taking any Aspirin (unless otherwise instructed by your surgeon), Aleve, Naproxen, Ibuprofen, Motrin, Advil, Goody's, BC's, all herbal medications, fish oil, and all vitamins (vitamin D3)    The Morning of Surgery  Do not wear jewelry  Do not wear lotions, powders, colognes, or deodorant   Men may shave face and neck.  Do not bring valuables to the hospital.  [facility] is not responsible for any belongings or valuables.  If you are a smoker, DO NOT Smoke 24 hours prior to surgery  If you wear a CPAP at night please bring your mask the morning of surgery   Remember that you must have someone to transport you home after your surgery, and remain with you for 24 hours if you are discharged the same day.   Please bring cases for contacts, glasses, hearing aids, dentures or bridgework because it cannot be worn into surgery.    Leave your suitcase in the car.  After surgery it may be brought to your room.  For patients admitted to the hospital, discharge time will be determined by your treatment team.  Patients discharged the day of surgery will not be allowed to drive  home.    Special instructions:   [place]- Preparing For Surgery  Before surgery, you can play an important role. Because skin is not sterile, your skin needs to be as free of germs as possible. You can reduce the number of germs on your skin by washing with CHG (chlorahexidine gluconate) Soap before surgery.  CHG is an antiseptic cleaner which kills germs and bonds with the skin to continue killing germs even after washing.    Oral Hygiene is also important to reduce your risk of infection.  Remember - BRUSH YOUR TEETH THE MORNING OF SURGERY WITH YOUR REGULAR TOOTHPASTE  Please do not use if you have an allergy to CHG or antibacterial soaps. If your skin becomes reddened/irritated stop using the CHG.  Do not shave (including legs and underarms) for at least 48 hours prior to first CHG shower. It is OK to shave your face.  Please follow these instructions carefully.   1. Shower the NIGHT BEFORE SURGERY and the MORNING OF SURGERY with CHG Soap.   2. If you chose to wash your hair and body, wash as usual with your normal shampoo and body-wash/soap.  3. Rinse your hair and body thoroughly to remove the shampoo and soap.  4. Apply CHG directly to the skin (ONLY FROM THE NECK DOWN) and wash gently with a scrungie or a clean washcloth.   5. Do not use on open wounds or open sores. Avoid contact  with your eyes, ears, mouth and genitals (private parts). Wash Face and genitals (private parts)  with your normal soap.   6. Wash thoroughly, paying special attention to the area where your surgery will be performed.  7. Thoroughly rinse your body with warm water from the neck down.  8. DO NOT shower/wash with your normal soap after using and rinsing off the CHG Soap.  9. Pat yourself dry with a CLEAN TOWEL.  10. Wear CLEAN PAJAMAS to bed the night before surgery  11. Place CLEAN SHEETS on your bed the night of your first shower and DO NOT SLEEP WITH PETS.  12. Wear comfortable clothes the  morning of surgery.     Day of Surgery:  Please shower the morning of surgery with the CHG soap Do not apply any deodorants/lotions. Please wear clean clothes to the hospital/surgery center.   Remember to brush your teeth WITH YOUR REGULAR TOOTHPASTE.   Please read over the following fact sheets that you were given.

## 2019-06-30 NOTE — Progress Notes (Signed)
PCP - Rodriguez-Southworth PA-C Cardiologist - pt denies   Chest x-ray - n/a EKG - 06/12/19 Stress Test - pt denies ECHO - pt denies Cardiac Cath - pt denies  Sleep Study - yes CPAP - pt switched from CPAP to BPAP   ERAS Protcol - yes  PRE-SURGERY Ensure or G2- ensure  COVID TEST- 06/30/19 after PAT appt  Coronavirus Screening  Have you experienced the following symptoms:  Cough yes/no: No Fever (>100.52F)  yes/no: No Runny nose yes/no: No Sore throat yes/no: No Difficulty breathing/shortness of breath  yes/no: No  Have you or a family member traveled in the last 14 days and where? yes/no: No   If the patient indicates "YES" to the above questions, their PAT will be rescheduled to limit the exposure to others and, the surgeon will be notified. THE PATIENT WILL NEED TO BE ASYMPTOMATIC FOR 14 DAYS.   If the patient is not experiencing any of these symptoms, the PAT nurse will instruct them to NOT bring anyone with them to their appointment since they may have these symptoms or traveled as well.   Please remind your patients and families that hospital visitation restrictions are in effect and the importance of the restrictions.     Anesthesia review: n/a  Patient denies shortness of breath, fever, cough and chest pain at PAT appointment   All instructions explained to the patient, with a verbal understanding of the material. Patient agrees to go over the instructions while at home for a better understanding. Patient also instructed to self quarantine after being tested for COVID-19. The opportunity to ask questions was provided.

## 2019-07-02 NOTE — Anesthesia Preprocedure Evaluation (Addendum)
Anesthesia Evaluation  Patient identified by MRN, date of birth, ID band Patient awake    Reviewed: Allergy & Precautions, NPO status , Patient's Chart, lab work & pertinent test results  History of Anesthesia Complications Negative for: history of anesthetic complications  Airway Mallampati: III  TM Distance: >3 FB Neck ROM: Limited    Dental  (+) Teeth Intact, Dental Advisory Given   Pulmonary sleep apnea and Continuous Positive Airway Pressure Ventilation ,    Pulmonary exam normal        Cardiovascular hypertension, Pt. on medications Normal cardiovascular exam     Neuro/Psych negative psych ROS   GI/Hepatic negative GI ROS, Neg liver ROS,   Endo/Other  negative endocrine ROS  Renal/GU negative Renal ROS  negative genitourinary   Musculoskeletal negative musculoskeletal ROS (+)   Abdominal   Peds  Hematology negative hematology ROS (+)   Anesthesia Other Findings   Reproductive/Obstetrics                           Anesthesia Physical Anesthesia Plan  ASA: III  Anesthesia Plan: General   Post-op Pain Management:    Induction: Intravenous  PONV Risk Score and Plan: 2 and Ondansetron, Dexamethasone, Treatment may vary due to age or medical condition and Midazolam  Airway Management Planned: Oral ETT and Video Laryngoscope Planned  Additional Equipment: None  Intra-op Plan:   Post-operative Plan: Extubation in OR  Informed Consent: I have reviewed the patients History and Physical, chart, labs and discussed the procedure including the risks, benefits and alternatives for the proposed anesthesia with the patient or authorized representative who has indicated his/her understanding and acceptance.     Dental advisory given  Plan Discussed with:   Anesthesia Plan Comments:        Anesthesia Quick Evaluation

## 2019-07-03 ENCOUNTER — Ambulatory Visit (HOSPITAL_COMMUNITY): Payer: BC Managed Care – PPO

## 2019-07-03 ENCOUNTER — Other Ambulatory Visit: Payer: Self-pay

## 2019-07-03 ENCOUNTER — Ambulatory Visit (HOSPITAL_COMMUNITY): Payer: BC Managed Care – PPO | Admitting: Physician Assistant

## 2019-07-03 ENCOUNTER — Ambulatory Visit (HOSPITAL_COMMUNITY)
Admission: RE | Disposition: A | Discharge status: Home / Self Care | Payer: Self-pay | Source: Home / Self Care | Attending: Orthopedic Surgery

## 2019-07-03 ENCOUNTER — Encounter (HOSPITAL_COMMUNITY): Payer: Self-pay | Admitting: Orthopedic Surgery

## 2019-07-03 ENCOUNTER — Observation Stay (HOSPITAL_COMMUNITY)
Admission: RE | Admit: 2019-07-03 | Discharge: 2019-07-04 | Disposition: A | Discharge status: Home / Self Care | Payer: BC Managed Care – PPO | Attending: Orthopedic Surgery | Admitting: Orthopedic Surgery

## 2019-07-03 DIAGNOSIS — M4322 Fusion of spine, cervical region: Secondary | ICD-10-CM | POA: Diagnosis not present

## 2019-07-03 DIAGNOSIS — G4710 Hypersomnia, unspecified: Secondary | ICD-10-CM | POA: Diagnosis not present

## 2019-07-03 DIAGNOSIS — M5412 Radiculopathy, cervical region: Secondary | ICD-10-CM

## 2019-07-03 DIAGNOSIS — G9529 Other cord compression: Secondary | ICD-10-CM | POA: Diagnosis not present

## 2019-07-03 DIAGNOSIS — Z79899 Other long term (current) drug therapy: Secondary | ICD-10-CM | POA: Diagnosis not present

## 2019-07-03 DIAGNOSIS — G4730 Sleep apnea, unspecified: Secondary | ICD-10-CM | POA: Diagnosis not present

## 2019-07-03 DIAGNOSIS — M5410 Radiculopathy, site unspecified: Secondary | ICD-10-CM | POA: Diagnosis present

## 2019-07-03 DIAGNOSIS — G9520 Unspecified cord compression: Secondary | ICD-10-CM | POA: Diagnosis not present

## 2019-07-03 DIAGNOSIS — G959 Disease of spinal cord, unspecified: Secondary | ICD-10-CM | POA: Diagnosis not present

## 2019-07-03 DIAGNOSIS — M5000 Cervical disc disorder with myelopathy, unspecified cervical region: Secondary | ICD-10-CM | POA: Diagnosis not present

## 2019-07-03 DIAGNOSIS — Z419 Encounter for procedure for purposes other than remedying health state, unspecified: Secondary | ICD-10-CM

## 2019-07-03 DIAGNOSIS — I10 Essential (primary) hypertension: Secondary | ICD-10-CM | POA: Insufficient documentation

## 2019-07-03 HISTORY — PX: ANTERIOR CERVICAL DECOMPRESSION/DISCECTOMY FUSION 4 LEVELS: SHX5556

## 2019-07-03 SURGERY — ANTERIOR CERVICAL DECOMPRESSION/DISCECTOMY FUSION 4 LEVELS
Anesthesia: General

## 2019-07-03 MED ORDER — MIDAZOLAM HCL 2 MG/2ML IJ SOLN
INTRAMUSCULAR | Status: DC | PRN
  Administered 2019-07-03: 2 mg via INTRAVENOUS

## 2019-07-03 MED ORDER — ONDANSETRON HCL 4 MG/2ML IJ SOLN
INTRAMUSCULAR | Status: DC | PRN
  Administered 2019-07-03: 4 mg via INTRAVENOUS

## 2019-07-03 MED ORDER — ALBUMIN HUMAN 5 % IV SOLN: INTRAVENOUS | Status: DC | PRN

## 2019-07-03 MED ORDER — POVIDONE-IODINE 7.5 % EX SOLN
Freq: Once | CUTANEOUS | Status: DC
  Filled 2019-07-03: qty 118

## 2019-07-03 MED ORDER — ONDANSETRON HCL 4 MG PO TABS: 4.0000 mg | ORAL_TABLET | Freq: Four times a day (QID) | ORAL | Status: DC | PRN

## 2019-07-03 MED ORDER — PHENYLEPHRINE 40 MCG/ML (10ML) SYRINGE FOR IV PUSH (FOR BLOOD PRESSURE SUPPORT)
PREFILLED_SYRINGE | INTRAVENOUS | Status: AC
  Filled 2019-07-03: qty 10

## 2019-07-03 MED ORDER — 0.9 % SODIUM CHLORIDE (POUR BTL) OPTIME
TOPICAL | Status: DC | PRN
  Administered 2019-07-03 (×3): 1000 mL

## 2019-07-03 MED ORDER — HYDROCHLOROTHIAZIDE 12.5 MG PO CAPS
12.5000 mg | ORAL_CAPSULE | Freq: Every day | ORAL | Status: DC
  Administered 2019-07-04: 12.5 mg via ORAL
  Filled 2019-07-03: qty 1

## 2019-07-03 MED ORDER — OXYCODONE-ACETAMINOPHEN 5-325 MG PO TABS
1.0000 | ORAL_TABLET | ORAL | Status: DC | PRN
  Administered 2019-07-03 – 2019-07-04 (×4): 2 via ORAL
  Filled 2019-07-03 (×4): qty 2

## 2019-07-03 MED ORDER — ACETAMINOPHEN 325 MG PO TABS: 650.0000 mg | ORAL_TABLET | ORAL | Status: DC | PRN

## 2019-07-03 MED ORDER — SODIUM CHLORIDE 0.9% FLUSH
3.0000 mL | Freq: Two times a day (BID) | INTRAVENOUS | Status: DC
  Administered 2019-07-03: 3 mL via INTRAVENOUS

## 2019-07-03 MED ORDER — ROCURONIUM BROMIDE 10 MG/ML (PF) SYRINGE
PREFILLED_SYRINGE | INTRAVENOUS | Status: DC | PRN
  Administered 2019-07-03: 20 mg via INTRAVENOUS
  Administered 2019-07-03: 30 mg via INTRAVENOUS
  Administered 2019-07-03: 10 mg via INTRAVENOUS
  Administered 2019-07-03: 20 mg via INTRAVENOUS
  Administered 2019-07-03: 70 mg via INTRAVENOUS

## 2019-07-03 MED ORDER — MENTHOL 3 MG MT LOZG: 1.0000 | LOZENGE | OROMUCOSAL | Status: DC | PRN

## 2019-07-03 MED ORDER — ONDANSETRON HCL 4 MG/2ML IJ SOLN
INTRAMUSCULAR | Status: AC
  Filled 2019-07-03: qty 2

## 2019-07-03 MED ORDER — DOCUSATE SODIUM 100 MG PO CAPS
100.0000 mg | ORAL_CAPSULE | Freq: Two times a day (BID) | ORAL | Status: DC
  Administered 2019-07-03 – 2019-07-04 (×2): 100 mg via ORAL
  Filled 2019-07-03 (×2): qty 1

## 2019-07-03 MED ORDER — OXYCODONE HCL 5 MG/5ML PO SOLN: 5.0000 mg | Freq: Once | ORAL | Status: DC | PRN

## 2019-07-03 MED ORDER — LACTATED RINGERS IV SOLN: INTRAVENOUS | Status: DC | PRN

## 2019-07-03 MED ORDER — PANTOPRAZOLE SODIUM 40 MG IV SOLR: 40.0000 mg | Freq: Every day | INTRAVENOUS | Status: DC

## 2019-07-03 MED ORDER — LIDOCAINE 2% (20 MG/ML) 5 ML SYRINGE
INTRAMUSCULAR | Status: DC | PRN
  Administered 2019-07-03: 100 mg via INTRAVENOUS

## 2019-07-03 MED ORDER — ONDANSETRON HCL 4 MG/2ML IJ SOLN: 4.0000 mg | Freq: Four times a day (QID) | INTRAMUSCULAR | Status: DC | PRN

## 2019-07-03 MED ORDER — PHENYLEPHRINE HCL-NACL 10-0.9 MG/250ML-% IV SOLN
INTRAVENOUS | Status: DC | PRN
  Administered 2019-07-03: 25 ug/min via INTRAVENOUS

## 2019-07-03 MED ORDER — ROCURONIUM BROMIDE 10 MG/ML (PF) SYRINGE
PREFILLED_SYRINGE | INTRAVENOUS | Status: AC
  Filled 2019-07-03: qty 10

## 2019-07-03 MED ORDER — METHOCARBAMOL 500 MG PO TABS
500.0000 mg | ORAL_TABLET | Freq: Three times a day (TID) | ORAL | Status: DC
  Administered 2019-07-03 – 2019-07-04 (×3): 500 mg via ORAL
  Filled 2019-07-03 (×3): qty 1

## 2019-07-03 MED ORDER — PANTOPRAZOLE SODIUM 40 MG PO TBEC
40.0000 mg | DELAYED_RELEASE_TABLET | Freq: Every day | ORAL | Status: DC
  Administered 2019-07-03: 22:00:00 40 mg via ORAL
  Filled 2019-07-03: qty 1

## 2019-07-03 MED ORDER — BUPIVACAINE-EPINEPHRINE 0.25% -1:200000 IJ SOLN
INTRAMUSCULAR | Status: DC | PRN
  Administered 2019-07-03: 3.5 mg

## 2019-07-03 MED ORDER — PHENYLEPHRINE 40 MCG/ML (10ML) SYRINGE FOR IV PUSH (FOR BLOOD PRESSURE SUPPORT)
PREFILLED_SYRINGE | INTRAVENOUS | Status: DC | PRN
  Administered 2019-07-03: 160 ug via INTRAVENOUS
  Administered 2019-07-03 (×2): 120 ug via INTRAVENOUS

## 2019-07-03 MED ORDER — SODIUM CHLORIDE 0.9 % IV SOLN: 250.0000 mL | INTRAVENOUS | Status: DC

## 2019-07-03 MED ORDER — CYCLOBENZAPRINE HCL 5 MG PO TABS: 5.0000 mg | ORAL_TABLET | Freq: Every day | ORAL | Status: DC

## 2019-07-03 MED ORDER — LORATADINE 10 MG PO TABS: 10.0000 mg | ORAL_TABLET | Freq: Every day | ORAL | Status: DC

## 2019-07-03 MED ORDER — OXYCODONE HCL 5 MG PO TABS: 5.0000 mg | ORAL_TABLET | Freq: Once | ORAL | Status: DC | PRN

## 2019-07-03 MED ORDER — FLEET ENEMA 7-19 GM/118ML RE ENEM: 1.0000 | ENEMA | Freq: Once | RECTAL | Status: DC | PRN

## 2019-07-03 MED ORDER — PROPOFOL 10 MG/ML IV BOLUS
INTRAVENOUS | Status: AC
  Filled 2019-07-03: qty 20

## 2019-07-03 MED ORDER — FENTANYL CITRATE (PF) 250 MCG/5ML IJ SOLN
INTRAMUSCULAR | Status: AC
  Filled 2019-07-03: qty 5

## 2019-07-03 MED ORDER — CEFAZOLIN SODIUM-DEXTROSE 2-4 GM/100ML-% IV SOLN
2.0000 g | INTRAVENOUS | Status: AC
  Administered 2019-07-03: 2 g via INTRAVENOUS
  Filled 2019-07-03: qty 100

## 2019-07-03 MED ORDER — FENTANYL CITRATE (PF) 100 MCG/2ML IJ SOLN
25.0000 ug | INTRAMUSCULAR | Status: DC | PRN
  Administered 2019-07-03: 50 ug via INTRAVENOUS

## 2019-07-03 MED ORDER — CEFAZOLIN SODIUM-DEXTROSE 2-4 GM/100ML-% IV SOLN
2.0000 g | Freq: Three times a day (TID) | INTRAVENOUS | Status: AC
  Administered 2019-07-03 – 2019-07-04 (×2): 2 g via INTRAVENOUS
  Filled 2019-07-03 (×2): qty 100

## 2019-07-03 MED ORDER — SENNOSIDES-DOCUSATE SODIUM 8.6-50 MG PO TABS: 1.0000 | ORAL_TABLET | Freq: Every evening | ORAL | Status: DC | PRN

## 2019-07-03 MED ORDER — BISACODYL 5 MG PO TBEC: 5.0000 mg | DELAYED_RELEASE_TABLET | Freq: Every day | ORAL | Status: DC | PRN

## 2019-07-03 MED ORDER — FENTANYL CITRATE (PF) 100 MCG/2ML IJ SOLN
INTRAMUSCULAR | Status: AC
  Filled 2019-07-03: qty 2

## 2019-07-03 MED ORDER — LIDOCAINE 2% (20 MG/ML) 5 ML SYRINGE
INTRAMUSCULAR | Status: AC
  Filled 2019-07-03: qty 5

## 2019-07-03 MED ORDER — FENTANYL CITRATE (PF) 100 MCG/2ML IJ SOLN
INTRAMUSCULAR | Status: DC | PRN
  Administered 2019-07-03: 100 ug via INTRAVENOUS
  Administered 2019-07-03: 50 ug via INTRAVENOUS
  Administered 2019-07-03: 100 ug via INTRAVENOUS

## 2019-07-03 MED ORDER — THROMBIN 20000 UNITS EX SOLR
CUTANEOUS | Status: DC | PRN
  Administered 2019-07-03: 20000 [IU] via TOPICAL

## 2019-07-03 MED ORDER — ACETAMINOPHEN 650 MG RE SUPP: 650.0000 mg | RECTAL | Status: DC | PRN

## 2019-07-03 MED ORDER — MIDAZOLAM HCL 2 MG/2ML IJ SOLN
INTRAMUSCULAR | Status: AC
  Filled 2019-07-03: qty 2

## 2019-07-03 MED ORDER — PHENOL 1.4 % MT LIQD
1.0000 | OROMUCOSAL | Status: DC | PRN
  Administered 2019-07-04: 1 via OROMUCOSAL
  Filled 2019-07-03: qty 177

## 2019-07-03 MED ORDER — PROPOFOL 10 MG/ML IV BOLUS
INTRAVENOUS | Status: DC | PRN
  Administered 2019-07-03: 200 mg via INTRAVENOUS

## 2019-07-03 MED ORDER — SODIUM CHLORIDE 0.9% FLUSH: 3.0000 mL | INTRAVENOUS | Status: DC | PRN

## 2019-07-03 MED ORDER — VITAMIN D-3 125 MCG (5000 UT) PO TABS: 5000.0000 [IU] | ORAL_TABLET | Freq: Every day | ORAL | Status: DC

## 2019-07-03 MED ORDER — HEMOSTATIC AGENTS (NO CHARGE) OPTIME
TOPICAL | Status: DC | PRN
  Administered 2019-07-03 (×2): 1 via TOPICAL

## 2019-07-03 MED ORDER — THROMBIN 20000 UNITS EX KIT
PACK | CUTANEOUS | Status: AC
  Filled 2019-07-03: qty 1

## 2019-07-03 MED ORDER — DEXMEDETOMIDINE HCL IN NACL 200 MCG/50ML IV SOLN
INTRAVENOUS | Status: DC | PRN
  Administered 2019-07-03: 8 ug via INTRAVENOUS
  Administered 2019-07-03: 12 ug via INTRAVENOUS

## 2019-07-03 MED ORDER — POLYVINYL ALCOHOL-POVIDONE 5-6 MG/ML OP SOLN: Freq: Three times a day (TID) | OPHTHALMIC | Status: DC | PRN

## 2019-07-03 MED ORDER — DEXAMETHASONE SODIUM PHOSPHATE 10 MG/ML IJ SOLN
INTRAMUSCULAR | Status: AC
  Filled 2019-07-03: qty 1

## 2019-07-03 MED ORDER — SUGAMMADEX SODIUM 200 MG/2ML IV SOLN
INTRAVENOUS | Status: DC | PRN
  Administered 2019-07-03: 200 mg via INTRAVENOUS

## 2019-07-03 MED ORDER — ALBUTEROL SULFATE HFA 108 (90 BASE) MCG/ACT IN AERS
INHALATION_SPRAY | RESPIRATORY_TRACT | Status: DC | PRN
  Administered 2019-07-03: 2 via RESPIRATORY_TRACT

## 2019-07-03 MED ORDER — LISINOPRIL 10 MG PO TABS
10.0000 mg | ORAL_TABLET | Freq: Every day | ORAL | Status: DC
  Administered 2019-07-03 – 2019-07-04 (×2): 10 mg via ORAL
  Filled 2019-07-03 (×2): qty 1

## 2019-07-03 MED ORDER — ONDANSETRON HCL 4 MG/2ML IJ SOLN: 4.0000 mg | Freq: Once | INTRAMUSCULAR | Status: DC | PRN

## 2019-07-03 MED ORDER — ALUM & MAG HYDROXIDE-SIMETH 200-200-20 MG/5ML PO SUSP: 30.0000 mL | Freq: Four times a day (QID) | ORAL | Status: DC | PRN

## 2019-07-03 MED ORDER — DEXAMETHASONE SODIUM PHOSPHATE 10 MG/ML IJ SOLN
INTRAMUSCULAR | Status: DC | PRN
  Administered 2019-07-03: 5 mg via INTRAVENOUS

## 2019-07-03 MED ORDER — ZOLPIDEM TARTRATE 5 MG PO TABS: 5.0000 mg | ORAL_TABLET | Freq: Every evening | ORAL | Status: DC | PRN

## 2019-07-03 MED ORDER — BUPIVACAINE HCL (PF) 0.25 % IJ SOLN
INTRAMUSCULAR | Status: AC
  Filled 2019-07-03: qty 30

## 2019-07-03 MED ORDER — EPINEPHRINE PF 1 MG/ML IJ SOLN
INTRAMUSCULAR | Status: AC
  Filled 2019-07-03: qty 1

## 2019-07-03 SURGICAL SUPPLY — 83 items
AGENT HMST KT MTR STRL THRMB (HEMOSTASIS) ×1
APL SKNCLS STERI-STRIP NONHPOA (GAUZE/BANDAGES/DRESSINGS) ×1
BENZOIN TINCTURE PRP APPL 2/3 (GAUZE/BANDAGES/DRESSINGS) ×3 IMPLANT
BIT DRILL NEURO 2X3.1 SFT TUCH (MISCELLANEOUS) ×1 IMPLANT
BIT DRILL SRG 14X2.2XFLT CHK (BIT) IMPLANT
BIT DRL SRG 14X2.2XFLT CHK (BIT) ×1
BLADE CLIPPER SURG (BLADE) ×3 IMPLANT
BLADE SURG 15 STRL LF DISP TIS (BLADE) ×1 IMPLANT
BLADE SURG 15 STRL SS (BLADE) ×3
BONE VIVIGEN FORMABLE 1.3CC (Bone Implant) ×6 IMPLANT
BONE VIVIGEN FORMABLE 5.4CC (Bone Implant) ×3 IMPLANT
BUR MATCHSTICK NEURO 3.0 LAGG (BURR) IMPLANT
CARTRIDGE OIL MAESTRO DRILL (MISCELLANEOUS) ×1 IMPLANT
CLOSURE STERI-STRIP 1/2X4 (GAUZE/BANDAGES/DRESSINGS) ×1
CLOSURE WOUND 1/2 X4 (GAUZE/BANDAGES/DRESSINGS) ×1
CLSR STERI-STRIP ANTIMIC 1/2X4 (GAUZE/BANDAGES/DRESSINGS) ×1 IMPLANT
COVER SURGICAL LIGHT HANDLE (MISCELLANEOUS) ×3 IMPLANT
COVER WAND RF STERILE (DRAPES) ×3 IMPLANT
DECANTER SPIKE VIAL GLASS SM (MISCELLANEOUS) ×3 IMPLANT
DIFFUSER DRILL AIR PNEUMATIC (MISCELLANEOUS) ×3 IMPLANT
DRAIN JACKSON RD 7FR 3/32 (WOUND CARE) IMPLANT
DRAPE C-ARM 42X72 X-RAY (DRAPES) ×3 IMPLANT
DRAPE POUCH INSTRU U-SHP 10X18 (DRAPES) ×3 IMPLANT
DRAPE SURG 17X23 STRL (DRAPES) ×9 IMPLANT
DRILL BIT SKYLINE 14MM (BIT) ×3
DRILL NEURO 2X3.1 SOFT TOUCH (MISCELLANEOUS) ×3
DURAPREP 26ML APPLICATOR (WOUND CARE) ×3 IMPLANT
ELECT COATED BLADE 2.86 ST (ELECTRODE) ×3 IMPLANT
ELECT REM PT RETURN 9FT ADLT (ELECTROSURGICAL) ×3
ELECTRODE REM PT RTRN 9FT ADLT (ELECTROSURGICAL) ×1 IMPLANT
EVACUATOR SILICONE 100CC (DRAIN) IMPLANT
GAUZE 4X4 16PLY RFD (DISPOSABLE) ×3 IMPLANT
GAUZE SPONGE 4X4 12PLY STRL (GAUZE/BANDAGES/DRESSINGS) ×3 IMPLANT
GLOVE BIO SURGEON STRL SZ7 (GLOVE) ×3 IMPLANT
GLOVE BIO SURGEON STRL SZ8 (GLOVE) ×3 IMPLANT
GLOVE BIOGEL PI IND STRL 7.0 (GLOVE) ×2 IMPLANT
GLOVE BIOGEL PI IND STRL 8 (GLOVE) ×1 IMPLANT
GLOVE BIOGEL PI INDICATOR 7.0 (GLOVE) ×4
GLOVE BIOGEL PI INDICATOR 8 (GLOVE) ×2
GOWN STRL REUS W/ TWL LRG LVL3 (GOWN DISPOSABLE) ×1 IMPLANT
GOWN STRL REUS W/ TWL XL LVL3 (GOWN DISPOSABLE) ×1 IMPLANT
GOWN STRL REUS W/TWL LRG LVL3 (GOWN DISPOSABLE) ×3
GOWN STRL REUS W/TWL XL LVL3 (GOWN DISPOSABLE) ×3
GRAFT BNE MATRIX VG FRMBL MD 5 (Bone Implant) IMPLANT
GRAFT BNE MATRIX VG FRMBL SM 1 (Bone Implant) IMPLANT
INTERLOCK LRDTC CRVCL VBR 7MM (Bone Implant) IMPLANT
INTERLOCK LRDTC CRVCL VBR 8MM (Peek) IMPLANT
IV CATH 14GX2 1/4 (CATHETERS) ×3 IMPLANT
KIT BASIN OR (CUSTOM PROCEDURE TRAY) ×3 IMPLANT
KIT TURNOVER KIT B (KITS) ×3 IMPLANT
LORDOTIC CERVICAL VBR 7MM SM (Bone Implant) ×3 IMPLANT
LORDOTIC CERVICAL VBR 8MM SM (Peek) ×6 IMPLANT
MANIFOLD NEPTUNE II (INSTRUMENTS) ×3 IMPLANT
NDL PRECISIONGLIDE 27X1.5 (NEEDLE) ×1 IMPLANT
NDL SPNL 20GX3.5 QUINCKE YW (NEEDLE) ×1 IMPLANT
NEEDLE PRECISIONGLIDE 27X1.5 (NEEDLE) ×3 IMPLANT
NEEDLE SPNL 20GX3.5 QUINCKE YW (NEEDLE) ×3 IMPLANT
NS IRRIG 1000ML POUR BTL (IV SOLUTION) ×3 IMPLANT
OIL CARTRIDGE MAESTRO DRILL (MISCELLANEOUS) ×3
PACK ORTHO CERVICAL (CUSTOM PROCEDURE TRAY) ×3 IMPLANT
PAD ARMBOARD 7.5X6 YLW CONV (MISCELLANEOUS) ×6 IMPLANT
PATTIES SURGICAL .5 X.5 (GAUZE/BANDAGES/DRESSINGS) IMPLANT
PATTIES SURGICAL .5 X1 (DISPOSABLE) IMPLANT
PIN DISTRACTION 14 (PIN) ×4 IMPLANT
PLATE SKYLINE 3 LVL 48MM (Plate) ×2 IMPLANT
POSITIONER HEAD DONUT 9IN (MISCELLANEOUS) ×3 IMPLANT
SCREW SKYLINE VAR OS 14MM (Screw) ×16 IMPLANT
SPONGE INTESTINAL PEANUT (DISPOSABLE) ×6 IMPLANT
SPONGE SURGIFOAM ABS GEL 100 (HEMOSTASIS) ×3 IMPLANT
STRIP CLOSURE SKIN 1/2X4 (GAUZE/BANDAGES/DRESSINGS) ×2 IMPLANT
SURGIFLO W/THROMBIN 8M KIT (HEMOSTASIS) ×2 IMPLANT
SUT MNCRL AB 4-0 PS2 18 (SUTURE) ×3 IMPLANT
SUT VIC AB 2-0 CT2 18 VCP726D (SUTURE) ×3 IMPLANT
SYR BULB IRRIGATION 50ML (SYRINGE) ×3 IMPLANT
SYR CONTROL 10ML LL (SYRINGE) ×6 IMPLANT
TAPE CLOTH 4X10 WHT NS (GAUZE/BANDAGES/DRESSINGS) ×3 IMPLANT
TAPE CLOTH SURG 4X10 WHT LF (GAUZE/BANDAGES/DRESSINGS) ×2 IMPLANT
TAPE UMBILICAL COTTON 1/8X30 (MISCELLANEOUS) ×3 IMPLANT
TOWEL GREEN STERILE (TOWEL DISPOSABLE) ×3 IMPLANT
TOWEL GREEN STERILE FF (TOWEL DISPOSABLE) ×3 IMPLANT
TRAY FOLEY MTR SLVR 16FR STAT (SET/KITS/TRAYS/PACK) ×3 IMPLANT
WATER STERILE IRR 1000ML POUR (IV SOLUTION) ×3 IMPLANT
YANKAUER SUCT BULB TIP NO VENT (SUCTIONS) ×3 IMPLANT

## 2019-07-03 NOTE — Anesthesia Procedure Notes (Addendum)
Procedure Name: Intubation Date/Time: 07/03/2019 7:48 AM Performed by: De Nurse, CRNA Pre-anesthesia Checklist: Patient identified, Emergency Drugs available, Suction available and Patient being monitored Patient Re-evaluated:Patient Re-evaluated prior to induction Oxygen Delivery Method: Circle System Utilized Preoxygenation: Pre-oxygenation with 100% oxygen Induction Type: IV induction Ventilation: Mask ventilation without difficulty Laryngoscope Size: Glidescope and 4 Grade View: Grade I Tube type: Oral Tube size: 7.5 mm Number of attempts: 1 Airway Equipment and Method: Stylet and Oral airway Placement Confirmation: ETT inserted through vocal cords under direct vision,  positive ETCO2 and breath sounds checked- equal and bilateral Secured at: 23 cm Tube secured with: Tape Dental Injury: Teeth and Oropharynx as per pre-operative assessment  Difficulty Due To: Difficulty was anticipated, Difficult Airway-  due to neck instability and Difficult Airway-  due to edematous airway Future Recommendations: Recommend- induction with short-acting agent, and alternative techniques readily available

## 2019-07-03 NOTE — Anesthesia Postprocedure Evaluation (Signed)
Anesthesia Post Note  Patient: Erik West  Procedure(s) Performed: ANTERIOR CERVICAL DECOMPRESSION FUSION CERVICAL 4-5, CERVICAL 5-6, CERVICAL 6-7 WITH INSTRUMENTATION AND ALLOGRAFT (N/A )     Patient location during evaluation: PACU Anesthesia Type: General Level of consciousness: awake and alert Pain management: pain level controlled Vital Signs Assessment: post-procedure vital signs reviewed and stable Respiratory status: spontaneous breathing, nonlabored ventilation and respiratory function stable Cardiovascular status: blood pressure returned to baseline and stable Postop Assessment: no apparent nausea or vomiting Anesthetic complications: no    Last Vitals:  Vitals:   07/03/19 1230 07/03/19 1246  BP: 117/63 124/79  Pulse: (!) 102 93  Resp: 20 20  Temp:  36.4 C  SpO2: 92% 93%    Last Pain:  Vitals:   07/03/19 1246  TempSrc:   PainSc: 7                  Lucretia Kern

## 2019-07-03 NOTE — H&P (Signed)
PREOPERATIVE H&P  Chief Complaint: Balance deterioration  HPI: Erik West is a 39 y.o. male who presents with progressive deterioration in balance  MRI reveals cord compression and stenosis spanning C4-C7  Patient has failed multiple forms of conservative care and continues to have pain (see office notes for additional details regarding the patient's full course of treatment)  Past Medical History:  Diagnosis Date  . Essential hypertension   . Hypersomnia   . Sleep apnea    diagnosed "maybe a year ago" uses CPAP   History reviewed. No pertinent surgical history. Social History   Socioeconomic History  . Marital status: Married    Spouse name: Not on file  . Number of children: 3  . Years of education: Not on file  . Highest education level: Not on file  Occupational History  . Not on file  Tobacco Use  . Smoking status: Never Smoker  . Smokeless tobacco: Never Used  Substance and Sexual Activity  . Alcohol use: Yes    Comment: occ  . Drug use: Never  . Sexual activity: Yes    Partners: Female    Comment: he is married  Other Topics Concern  . Not on file  Social History Narrative   Is a gamer and stays late at night to play.    Social Determinants of Health   Financial Resource Strain:   . Difficulty of Paying Living Expenses:   Food Insecurity:   . Worried About Charity fundraiser in the Last Year:   . Arboriculturist in the Last Year:   Transportation Needs:   . Film/video editor (Medical):   Marland Kitchen Lack of Transportation (Non-Medical):   Physical Activity:   . Days of Exercise per Week:   . Minutes of Exercise per Session:   Stress:   . Feeling of Stress :   Social Connections:   . Frequency of Communication with Friends and Family:   . Frequency of Social Gatherings with Friends and Family:   . Attends Religious Services:   . Active Member of Clubs or Organizations:   . Attends Archivist Meetings:   Marland Kitchen Marital Status:    Family  History  Problem Relation Age of Onset  . Hypertension Paternal Grandmother   . Cancer Other    No Known Allergies Prior to Admission medications   Medication Sig Start Date End Date Taking? Authorizing Provider  Aromatic Inhalants (VICKS BABYRUB EX) Apply 1 application topically 4 (four) times daily as needed (congestion).   Yes [provider]  Aromatic Inhalants (VICKS VAPOINHALER IN) Place 1 Dose into the nose 4 (four) times daily as needed (congestion.).   Yes [provider]  cetirizine (ZYRTEC) 10 MG tablet TAKE 1 TABLET BY MOUTH EVERY DAY Patient taking differently: Take 10 mg by mouth at bedtime as needed (allergies.).  04/21/19  Yes Rodriguez-Southworth, Sunday Spillers, PA-C  Cholecalciferol (VITAMIN D-3) 125 MCG (5000 UT) TABS Take 1 tablet by mouth daily. Patient taking differently: Take 5,000 Units by mouth daily.  12/25/18  Yes Hilts, Legrand Como, MD  cyclobenzaprine (FLEXERIL) 5 MG tablet Take 5-10 mg by mouth at bedtime as needed for spasms. 06/02/19  Yes [provider]  hydrochlorothiazide (HYDRODIURIL) 12.5 MG tablet Take 1 tablet (12.5 mg total) by mouth daily. 06/12/19  Yes [name], FNP  lisinopril (ZESTRIL) 10 MG tablet Take 1 tablet (10 mg total) by mouth daily. 06/12/19  Yes [name], FNP  methocarbamol (ROBAXIN) 500 MG  tablet Take 500 mg by mouth every 6 (six) hours as needed for spasms. 06/02/19  Yes [provider]  Polyvinyl Alcohol-Povidone (CLEAR EYES ALL SEASONS OP) Place 1 drop into both eyes 3 (three) times daily as needed (dry/irritated eyes.).   Yes [provider]  Throat Lozenges [facility] FRIEND MT) Use as directed 1 Dose in the mouth or throat 4 (four) times daily as needed (allergy symptoms.).   Yes [provider]  ULTRAM 50 MG tablet Take 50-100 mg by mouth every 6 (six) hours as needed for pain. 06/02/19  Yes [provider]  diclofenac sodium (VOLTAREN) 1 % GEL Apply 4 g topically 4 (four) times  daily as needed. Patient not taking: Reported on 06/12/2019 12/25/18   Hilts, Michael, MD     All other systems have been reviewed and were otherwise negative with the exception of those mentioned in the HPI and as above.  Physical Exam: Vitals:   07/03/19 0601 07/03/19 0614  BP:  132/90  Pulse: 86   Resp: 18   Temp: 98.3 F (36.8 C)   SpO2: 98%     Body mass index is 32.71 kg/m.  General: Alert, no acute distress Cardiovascular: No pedal edema Respiratory: No cyanosis, no use of accessory musculature Skin: No lesions in the area of chief complaint Neurologic: Sensation intact distally Psychiatric: Patient is competent for consent with normal mood and affect Lymphatic: No axillary or cervical lymphadenopathy  MUSCULOSKELETAL: + hoffmans b/l  Assessment/Plan: PROGRESSIVE CERVICAL MYELOPATHY Plan for Procedure(s): ANTERIOR CERVICAL DECOMPRESSION FUSION CERVICAL 4-5, CERVICAL 5-6, CERVICAL 6-7 WITH INSTRUMENTATION AND ALLOGRAFT   Jackelyn Hoehn, MD 07/03/2019 6:43 AM

## 2019-07-03 NOTE — Transfer of Care (Signed)
Immediate Anesthesia Transfer of Care Note  Patient: Erik West  Procedure(s) Performed: ANTERIOR CERVICAL DECOMPRESSION FUSION CERVICAL 4-5, CERVICAL 5-6, CERVICAL 6-7 WITH INSTRUMENTATION AND ALLOGRAFT (N/A )  Patient Location: PACU  Anesthesia Type:General  Level of Consciousness: lethargic and responds to stimulation  Airway & Oxygen Therapy: Patient Spontanous Breathing and Patient connected to face mask oxygen  Post-op Assessment: Report given to RN  Post vital signs: Reviewed and stable  Last Vitals:  Vitals Value Taken Time  BP 148/101 07/03/19 1130  Temp 36.3 C 07/03/19 1130  Pulse 100 07/03/19 1130  Resp 21 07/03/19 1130  SpO2 96 % 07/03/19 1130    Last Pain:  Vitals:   07/03/19 0610  TempSrc:   PainSc: 5       Patients Stated Pain Goal: 2 (07/03/19 0610)  Complications: No apparent anesthesia complications
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
## 2019-07-04 DIAGNOSIS — Z79899 Other long term (current) drug therapy: Secondary | ICD-10-CM | POA: Diagnosis not present

## 2019-07-04 DIAGNOSIS — G9520 Unspecified cord compression: Secondary | ICD-10-CM | POA: Diagnosis not present

## 2019-07-04 DIAGNOSIS — G4710 Hypersomnia, unspecified: Secondary | ICD-10-CM | POA: Diagnosis not present

## 2019-07-04 DIAGNOSIS — G4730 Sleep apnea, unspecified: Secondary | ICD-10-CM | POA: Diagnosis not present

## 2019-07-04 DIAGNOSIS — M5410 Radiculopathy, site unspecified: Secondary | ICD-10-CM | POA: Diagnosis not present

## 2019-07-04 DIAGNOSIS — I10 Essential (primary) hypertension: Secondary | ICD-10-CM | POA: Diagnosis not present

## 2019-07-04 MED ORDER — METHOCARBAMOL 500 MG PO TABS: 500.0000 mg | ORAL_TABLET | Freq: Three times a day (TID) | ORAL | 0 refills | Status: DC

## 2019-07-04 MED ORDER — OXYCODONE-ACETAMINOPHEN 5-325 MG PO TABS: 1.0000 | ORAL_TABLET | ORAL | 0 refills | Status: DC | PRN

## 2019-07-04 MED FILL — Thrombin For Soln Kit 20000 Unit: CUTANEOUS | Qty: 1 | Status: AC

## 2019-07-04 NOTE — Progress Notes (Signed)
Patient is discharged from room 3C06 at this time. Alert and in stable condition. IV site d/c'd and instructions read to patient with understanding verbalized and all questions answered. Left unit via wheelchair with all belongings at side.  

## 2019-07-04 NOTE — Progress Notes (Signed)
    Patient doing well PO day 1 with improvement in pre-op myelopathy symptoms, noted improved sensation in neck and arms both, he is very excited and pleased, mildly drowsy and eating breakfast. Mild throat soreness, + B/B function, doing well, has been up and eager to progress home    Physical Exam: Vitals:   07/03/19 2335 07/04/19 0339  BP: 135/84 116/84  Pulse: 97 86  Resp: 20 20  Temp:    SpO2: 97% 97%    Dressing in place, CDI, Hard collar in place and fitting well, sitting up eating breakfast  NVI  POD #1 s/p C4-7 ACDF for myelopathy with improvement in symptoms, doing very well  - encourage ambulation - Percocet for pain, robaxin for muscle spasms         -Scripts sent electronically to pharm  - Hard collar x4 weeks, shower collar after 5 days - d/c home today with f/u in 2 weeks

## 2019-07-09 ENCOUNTER — Encounter: Payer: Self-pay | Admitting: *Deleted

## 2019-07-16 DIAGNOSIS — Z9889 Other specified postprocedural states: Secondary | ICD-10-CM | POA: Diagnosis not present

## 2019-07-17 DIAGNOSIS — M542 Cervicalgia: Secondary | ICD-10-CM | POA: Diagnosis not present

## 2019-07-17 DIAGNOSIS — Z981 Arthrodesis status: Secondary | ICD-10-CM | POA: Diagnosis not present

## 2019-07-17 NOTE — Discharge Summary (Signed)
Patient ID: Erik West MRN: [id number] DOB/AGE: 05-20-1980 39 y.o.  Admit date: 07/03/2019 Discharge date: 07/04/2019  Admission Diagnoses:  Active Problems:   Radiculopathy   Discharge Diagnoses:  Same  Past Medical History:  Diagnosis Date  . Essential hypertension   . Hypersomnia   . Sleep apnea    diagnosed "maybe a year ago" uses CPAP    Surgeries: Procedure(s): ANTERIOR CERVICAL DECOMPRESSION FUSION CERVICAL 4-5, CERVICAL 5-6, CERVICAL 6-7 WITH INSTRUMENTATION AND ALLOGRAFT on 07/03/2019   Consultants: None  Discharged Condition: Improved  Hospital Course: Erik West is an 39 y.o. male who was admitted 07/03/2019 for operative treatment of myelopathy. Patient has severe unremitting pain that affects sleep, daily activities, and work/hobbies. After pre-op clearance the patient was taken to the operating room on 07/03/2019 and underwent  Procedure(s): ANTERIOR CERVICAL DECOMPRESSION FUSION CERVICAL 4-5, CERVICAL 5-6, CERVICAL 6-7 WITH INSTRUMENTATION AND ALLOGRAFT.    Patient was given perioperative antibiotics:  Anti-infectives (From admission, onward)   Start     Dose/Rate Route Frequency Ordered Stop   07/03/19 1530  ceFAZolin (ANCEF) IVPB 2g/100 mL premix     2 g 200 mL/hr over 30 Minutes Intravenous Every 8 hours 07/03/19 1309 07/04/19 0835   07/03/19 0600  ceFAZolin (ANCEF) IVPB 2g/100 mL premix     2 g 200 mL/hr over 30 Minutes Intravenous On call to O.R. 07/03/19 0557 07/03/19 0736       Patient was given sequential compression devices, early ambulation to prevent DVT.  Patient benefited maximally from hospital stay and there were no complications.    Recent vital signs: BP (!) 130/93 (BP Location: Right Arm)   Pulse 86   Temp 97.6 F (36.4 C) (Oral)   Resp 16   Ht 5\' 11"  (1.803 m)   Wt 106.4 kg   SpO2 97%   BMI 32.71 kg/m    Discharge Medications:   Allergies as of 07/04/2019   No Known Allergies     Medication List    STOP  taking these medications   Ultram 50 MG tablet Generic drug: traMADol     TAKE these medications   cetirizine 10 MG tablet Commonly known as: ZYRTEC TAKE 1 TABLET BY MOUTH EVERY DAY What changed:   when to take this  reasons to take this   CLEAR EYES ALL SEASONS OP Place 1 drop into both eyes 3 (three) times daily as needed (dry/irritated eyes.).   cyclobenzaprine 5 MG tablet Commonly known as: FLEXERIL Take 5-10 mg by mouth at bedtime as needed for spasms.   [employer] FRIEND MT Use as directed 1 Dose in the mouth or throat 4 (four) times daily as needed (allergy symptoms.).   hydrochlorothiazide 12.5 MG tablet Commonly known as: HYDRODIURIL Take 1 tablet (12.5 mg total) by mouth daily.   lisinopril 10 MG tablet Commonly known as: ZESTRIL Take 1 tablet (10 mg total) by mouth daily.   methocarbamol 500 MG tablet Commonly known as: ROBAXIN Take 500 mg by mouth every 6 (six) hours as needed for spasms. What changed: Another medication with the same name was added. Make sure you understand how and when to take each.   methocarbamol 500 MG tablet Commonly known as: ROBAXIN Take 1 tablet (500 mg total) by mouth 3 (three) times daily. What changed: You were already taking a medication with the same name, and this prescription was added. Make sure you understand how and when to take each.   oxyCODONE-acetaminophen 5-325 MG tablet Commonly  known as: PERCOCET/ROXICET Take 1-2 tablets by mouth every 4 (four) hours as needed for moderate pain or severe pain.   VICKS BABYRUB EX Apply 1 application topically 4 (four) times daily as needed (congestion).   VICKS VAPOINHALER IN Place 1 Dose into the nose 4 (four) times daily as needed (congestion.).   Vitamin D-3 125 MCG (5000 UT) Tabs Take 1 tablet by mouth daily. What changed: how much to take       Diagnostic Studies: DG Cervical Spine 2-3 Views  Result Date: 07/03/2019 CLINICAL DATA:  Operative imaging provided for  anterior cervical disc fusion, C4 through C7. EXAM: CERVICAL SPINE - 2-3 VIEW; DG C-ARM 1-60 MIN COMPARISON:  Cervical MRI, 01/18/2019. FINDINGS: Initial lateral portable image shows placement of a surgical probe within 1 cm of the anterior margin of the C5-C6 disc. Follow-up imaging shows placement intervertebral metallic cages/spacers at C4-C5, C5-C6 and C6-C7 with placement of an anterior fusion plate and fixation screws. The orthopedic hardware appears well positioned and well-seated. IMPRESSION: Imaging provided for anterior cervical disc fusion, C4 through C7. Electronically Signed   By: Lajean Manes M.D.   On: 07/03/2019 11:10   DG C-Arm 1-60 Min  Result Date: 07/03/2019 CLINICAL DATA:  Operative imaging provided for anterior cervical disc fusion, C4 through C7. EXAM: CERVICAL SPINE - 2-3 VIEW; DG C-ARM 1-60 MIN COMPARISON:  Cervical MRI, 01/18/2019. FINDINGS: Initial lateral portable image shows placement of a surgical probe within 1 cm of the anterior margin of the C5-C6 disc. Follow-up imaging shows placement intervertebral metallic cages/spacers at C4-C5, C5-C6 and C6-C7 with placement of an anterior fusion plate and fixation screws. The orthopedic hardware appears well positioned and well-seated. IMPRESSION: Imaging provided for anterior cervical disc fusion, C4 through C7. Electronically Signed   By: Lajean Manes M.D.   On: 07/03/2019 11:10    Disposition: Discharge disposition: 01-Home or Self Care       Discharge Instructions    Discharge patient   Complete by: As directed    Discharge disposition: 01-Home or Self Care   Discharge patient date: 07/04/2019      POD #1 s/p C4-7 ACDF for myelopathy with improvement in symptoms, doing very well  - encourage ambulation - Percocet for pain, robaxin for muscle spasms         -Scripts sent electronically to pharm  - Hard collar x4 weeks, shower collar after 5 days - d/c home today with f/u in 2 weeks  Signed: Lennie West Erik West  07/17/2019, 1:57 PM

## 2019-07-23 ENCOUNTER — Other Ambulatory Visit: Payer: Self-pay | Admitting: Internal Medicine

## 2019-08-15 DIAGNOSIS — Z9889 Other specified postprocedural states: Secondary | ICD-10-CM | POA: Diagnosis not present

## 2019-08-19 DIAGNOSIS — H04123 Dry eye syndrome of bilateral lacrimal glands: Secondary | ICD-10-CM | POA: Diagnosis not present

## 2019-08-19 DIAGNOSIS — H40033 Anatomical narrow angle, bilateral: Secondary | ICD-10-CM | POA: Diagnosis not present

## 2019-09-16 ENCOUNTER — Encounter: Payer: Self-pay | Admitting: Internal Medicine

## 2019-09-17 ENCOUNTER — Other Ambulatory Visit: Payer: Self-pay

## 2019-09-17 ENCOUNTER — Ambulatory Visit: Payer: BC Managed Care – PPO | Admitting: Nurse Practitioner

## 2019-09-17 ENCOUNTER — Encounter: Payer: Self-pay | Admitting: Nurse Practitioner

## 2019-09-17 VITALS — BP 168/118 | HR 98 | Temp 97.9°F | Ht 71.0 in | Wt 242.2 lb

## 2019-09-17 DIAGNOSIS — I10 Essential (primary) hypertension: Secondary | ICD-10-CM

## 2019-09-17 DIAGNOSIS — G4733 Obstructive sleep apnea (adult) (pediatric): Secondary | ICD-10-CM | POA: Diagnosis not present

## 2019-09-17 MED ORDER — HYDROCHLOROTHIAZIDE 25 MG PO TABS: 25.0000 mg | ORAL_TABLET | Freq: Every day | ORAL | 1 refills | Status: DC

## 2019-09-17 MED ORDER — MAGNESIUM 200 MG PO TABS: ORAL_TABLET | ORAL | 2 refills | Status: DC

## 2019-09-17 NOTE — Patient Instructions (Signed)
Use your CPAP more regularly and walk as tolerated

## 2019-09-17 NOTE — Progress Notes (Signed)
This visit occurred during the SARS-CoV-2 public health emergency.  Safety protocols were in place, including screening questions prior to the visit, additional usage of staff PPE, and extensive cleaning of exam room while observing appropriate contact time as indicated for disinfecting solutions.  Subjective:     Patient ID: Erik West , male    DOB: 01-15-1981 , 39 y.o.   MRN: [id number]   Chief Complaint  Patient presents with  . Hypertension    patient stated his bp has been still elevated he will check in first thing in the morning then take his med and check a couple hours later and it is still high     HPI  Wt Readings from Last 3 Encounters: 09/17/19 : 242 lb 3.2 oz (109.9 kg) 07/03/19 : 234 lb 8 oz (106.4 kg) 06/30/19 : 234 lb 8 oz (106.4 kg)  He continues to be out of work from his surgery to his neck.    Hypertension This is a chronic problem. The current episode started more than 1 year ago. The problem is unchanged. The problem is uncontrolled. Pertinent negatives include no anxiety, blurred vision, chest pain, headaches, palpitations or shortness of breath. Risk factors for coronary artery disease include sedentary lifestyle. Past treatments include ACE inhibitors. There are no compliance problems.  There is no history of angina. There is no history of chronic renal disease.     Past Medical History:  Diagnosis Date  . Essential hypertension   . Hypersomnia   . Sleep apnea    diagnosed "maybe a year ago" uses CPAP     Family History  Problem Relation Age of Onset  . Hypertension Paternal Grandmother   . Cancer Other      Current Outpatient Medications:  .  cetirizine (ZYRTEC) 10 MG tablet, TAKE 1 TABLET BY MOUTH EVERY DAY, Disp: 90 tablet, Rfl: 0 .  Cholecalciferol (VITAMIN D-3) 125 MCG (5000 UT) TABS, Take 1 tablet by mouth daily. (Patient taking differently: Take 5,000 Units by mouth daily. ), Disp: 90 tablet, Rfl: 3 .  hydrochlorothiazide  (HYDRODIURIL) 12.5 MG tablet, Take 1 tablet (12.5 mg total) by mouth daily., Disp: 90 tablet, Rfl: 1 .  lisinopril (ZESTRIL) 10 MG tablet, Take 1 tablet (10 mg total) by mouth daily., Disp: 90 tablet, Rfl: 1   No Known Allergies   Review of Systems  Constitutional: Negative.  Negative for fatigue.  Eyes: Negative for blurred vision.  Respiratory: Negative for shortness of breath.   Cardiovascular: Negative.  Negative for chest pain, palpitations and leg swelling.  Neurological: Negative for dizziness and headaches.  Psychiatric/Behavioral: Negative.      Today's Vitals   09/17/19 1053  BP: (!) 168/118  Pulse: 98  Temp: 97.9 F (36.6 C)  TempSrc: Oral  Weight: 242 lb 3.2 oz (109.9 kg)  Height: 5\' 11"  (1.803 m)  PainSc: 0-No pain   Body mass index is 33.78 kg/m.   Objective:  Physical Exam Constitutional:      General: He is not in acute distress.    Appearance: Normal appearance.  Cardiovascular:     Rate and Rhythm: Normal rate and regular rhythm.     Pulses: Normal pulses.     Heart sounds: Normal heart sounds. No murmur.  Pulmonary:     Effort: Pulmonary effort is normal. No respiratory distress.     Breath sounds: Normal breath sounds.  Skin:    Capillary Refill: Capillary refill takes less than 2 seconds.  Neurological:  General: No focal deficit present.     Mental Status: He is alert and oriented to person, place, and time.  Psychiatric:        Mood and Affect: Mood normal.        Behavior: Behavior normal.        Thought Content: Thought content normal.        Judgment: Judgment normal.         Assessment And Plan:     1. Uncontrolled hypertension  Has been elevated since his last office visit but worsened over the last week  Will increase his HCTZ to 25 mg daily he is to increase his water intake.  He has had an 8 lb weight gain which could be affecting his blood pressure. He is also out of work post neck surgery for the last 3 months so his  physical activity is decreased.  - hydrochlorothiazide (HYDRODIURIL) 25 MG tablet; Take 1 tablet (25 mg total) by mouth daily.  Dispense: 90 tablet; Refill: 1 - Magnesium 200 MG TABS; Take 1 tablet by mouth daily with evening meal  Dispense: 30 tablet; Refill: 2  2. OSA (obstructive sleep apnea)  Wears his CPAP every now and then,   Encouraged to wear more regularly this could be affecting his blood pressure   [name], FNP    THE PATIENT IS ENCOURAGED TO PRACTICE SOCIAL DISTANCING DUE TO THE COVID-19 PANDEMIC.

## 2019-09-30 ENCOUNTER — Other Ambulatory Visit: Payer: Self-pay

## 2019-09-30 ENCOUNTER — Ambulatory Visit: Payer: BC Managed Care – PPO

## 2019-09-30 VITALS — BP 130/82 | HR 75 | Temp 98.1°F | Ht 71.0 in | Wt 243.0 lb

## 2019-09-30 DIAGNOSIS — I10 Essential (primary) hypertension: Secondary | ICD-10-CM

## 2019-09-30 NOTE — Progress Notes (Signed)
Pt presents today for a b/p check 130/82 he said he increased his water he is using his cpap and started back working feeling pretty good Per JM: he needs to continue the regimen he is on.

## 2019-10-02 DIAGNOSIS — M542 Cervicalgia: Secondary | ICD-10-CM | POA: Diagnosis not present

## 2019-10-31 ENCOUNTER — Other Ambulatory Visit: Payer: Self-pay | Admitting: Internal Medicine

## 2019-11-21 DIAGNOSIS — Z20822 Contact with and (suspected) exposure to covid-19: Secondary | ICD-10-CM | POA: Diagnosis not present

## 2019-12-10 DIAGNOSIS — J029 Acute pharyngitis, unspecified: Secondary | ICD-10-CM | POA: Diagnosis not present

## 2019-12-10 DIAGNOSIS — R05 Cough: Secondary | ICD-10-CM | POA: Diagnosis not present

## 2019-12-11 ENCOUNTER — Encounter: Payer: BC Managed Care – PPO | Admitting: Internal Medicine

## 2019-12-18 ENCOUNTER — Other Ambulatory Visit: Payer: Self-pay

## 2019-12-18 ENCOUNTER — Encounter: Payer: Self-pay | Admitting: Nurse Practitioner

## 2019-12-18 ENCOUNTER — Ambulatory Visit: Payer: BC Managed Care – PPO | Admitting: Nurse Practitioner

## 2019-12-18 VITALS — BP 118/76 | HR 78 | Temp 98.3°F | Ht 71.0 in | Wt 221.0 lb

## 2019-12-18 DIAGNOSIS — E7800 Pure hypercholesterolemia, unspecified: Secondary | ICD-10-CM

## 2019-12-18 DIAGNOSIS — Z0000 Encounter for general adult medical examination without abnormal findings: Secondary | ICD-10-CM

## 2019-12-18 DIAGNOSIS — Z1159 Encounter for screening for other viral diseases: Secondary | ICD-10-CM | POA: Diagnosis not present

## 2019-12-18 DIAGNOSIS — I10 Essential (primary) hypertension: Secondary | ICD-10-CM | POA: Diagnosis not present

## 2019-12-18 DIAGNOSIS — Z6830 Body mass index (BMI) 30.0-30.9, adult: Secondary | ICD-10-CM | POA: Insufficient documentation

## 2019-12-18 DIAGNOSIS — Z2821 Immunization not carried out because of patient refusal: Secondary | ICD-10-CM

## 2019-12-18 LAB — POCT URINALYSIS DIPSTICK
Bilirubin, UA: NEGATIVE
Blood, UA: NEGATIVE
Glucose, UA: NEGATIVE
Ketones, UA: NEGATIVE
Leukocytes, UA: NEGATIVE
Nitrite, UA: NEGATIVE
Protein, UA: NEGATIVE
Spec Grav, UA: 1.025 (ref 1.010–1.025)
Urobilinogen, UA: 0.2 E.U./dL
pH, UA: 6 (ref 5.0–8.0)

## 2019-12-18 LAB — POCT UA - MICROALBUMIN
Albumin/Creatinine Ratio, Urine, POC: 30
Creatinine, POC: 300 mg/dL
Microalbumin Ur, POC: 10 mg/L

## 2019-12-18 MED ORDER — LISINOPRIL 10 MG PO TABS: 10.0000 mg | ORAL_TABLET | Freq: Every day | ORAL | 1 refills | Status: DC

## 2019-12-18 NOTE — Progress Notes (Signed)
Erik West as a scribe for [name], FNP.,have documented all relevant documentation on the behalf of [name], FNP,as directed by  [name], FNP while in the presence of [name], [place].  This visit occurred during the SARS-CoV-2 public health emergency.  Safety protocols were in place, including screening questions prior to the visit, additional usage of staff PPE, and extensive cleaning of exam room while observing appropriate contact time as indicated for disinfecting solutions.  Subjective:     Patient ID: Erik West , male    DOB: 09-09-1980 , 39 y.o.   MRN: [id number]   Chief Complaint  Patient presents with  . Annual Exam  . Hypertension    HPI  Pt is here for Health maintenance exam   Wt Readings from Last 3 Encounters: 12/18/19 : 221 lb (100.2 kg) 09/30/19 : 243 lb (110.2 kg) 09/17/19 : 242 lb 3.2 oz (109.9 kg)  He reports he had flu like symptoms last Tuesday - was tested for flu, strep and covid.  He was treated with antibiotics and prednisone at [facility] on [address].  He is feeling better.   Hypertension This is a chronic problem. The current episode started more than 1 year ago. The problem is unchanged. The problem is uncontrolled. Pertinent negatives include no anxiety, blurred vision, chest pain, headaches, palpitations or shortness of breath. Risk factors for coronary artery disease include sedentary lifestyle. Past treatments include ACE inhibitors. There are no compliance problems.  There is no history of angina. There is no history of chronic renal disease.     Past Medical History:  Diagnosis Date  . Essential hypertension   . Hypersomnia   . Sleep apnea    diagnosed "maybe a year ago" uses CPAP     Family History  Problem Relation Age of Onset  . Hypertension Paternal Grandmother   . Cancer Other      Current Outpatient Medications:  .  cetirizine (ZYRTEC) 10 MG tablet, TAKE 1 TABLET BY MOUTH EVERY DAY, Disp: 90  tablet, Rfl: 0 .  Cholecalciferol (VITAMIN D-3) 125 MCG (5000 UT) TABS, Take 1 tablet by mouth daily., Disp: 90 tablet, Rfl: 3 .  hydrochlorothiazide (HYDRODIURIL) 25 MG tablet, Take 1 tablet (25 mg total) by mouth daily., Disp: 90 tablet, Rfl: 1 .  lisinopril (ZESTRIL) 10 MG tablet, Take 1 tablet (10 mg total) by mouth daily., Disp: 90 tablet, Rfl: 1 .  Magnesium 200 MG TABS, Take 1 tablet by mouth daily with evening meal, Disp: 30 tablet, Rfl: 2   No Known Allergies   Men's preventive visit. Patient Health Questionnaire (PHQ-2) is    Office Visit from 12/18/2019 in [facility]  PHQ-2 Total Score 0     Patient is on a regular diet, intermittent fasting minimal carbs, mostly eating protein and greens. Exercising - daily - cardio/strength training (using through an APP) Marital status: Married. Relevant history for alcohol use is:  Social History   Substance and Sexual Activity  Alcohol Use Yes   Comment: occ   Relevant history for tobacco use is:  Social History   Tobacco Use  Smoking Status Never Smoker  Smokeless Tobacco Never Used    Review of Systems  Constitutional: Negative.   HENT: Negative.   Eyes: Negative for blurred vision.  Respiratory: Negative.  Negative for shortness of breath.   Cardiovascular: Negative for chest pain and palpitations.  Gastrointestinal: Negative.   Genitourinary: Negative.   Musculoskeletal: Negative.   Skin: Negative.  Neurological: Negative.  Negative for headaches.  Psychiatric/Behavioral: Negative.      Today's Vitals   12/18/19 1429  BP: 118/76  Pulse: 78  Temp: 98.3 F (36.8 C)  TempSrc: Oral  Weight: 221 lb (100.2 kg)  Height: _0  (1.803 m)  PainSc: 0-No pain   Body mass index is 30.82 kg/m.   Objective:  Physical Exam Vitals reviewed.  Constitutional:      General: He is not in acute distress.    Appearance: Normal appearance. He is obese.  HENT:     Head: Normocephalic and atraumatic.      Right Ear: Tympanic membrane, ear canal and external ear normal. There is no impacted cerumen.     Left Ear: Tympanic membrane, ear canal and external ear normal. There is no impacted cerumen.     Nose:     Comments: Deferred - masked    Mouth/Throat:     Comments: Deferred - masked Eyes:     Extraocular Movements: Extraocular movements intact.     Conjunctiva/sclera: Conjunctivae normal.     Pupils: Pupils are equal, round, and reactive to light.  Cardiovascular:     Rate and Rhythm: Normal rate and regular rhythm.     Pulses: Normal pulses.     Heart sounds: Normal heart sounds. No murmur heard.   Pulmonary:     Effort: Pulmonary effort is normal. No respiratory distress.     Breath sounds: Normal breath sounds.  Abdominal:     General: Abdomen is flat. Bowel sounds are normal. There is no distension.     Palpations: Abdomen is soft.  Genitourinary:    Prostate: Normal.     Rectum: Guaiac result negative.  Musculoskeletal:        General: Normal range of motion.     Cervical back: Normal range of motion and neck supple.  Skin:    General: Skin is warm.     Capillary Refill: Capillary refill takes less than 2 seconds.  Neurological:     General: No focal deficit present.     Mental Status: He is alert and oriented to person, place, and time.  Psychiatric:        Mood and Affect: Mood normal.        Behavior: Behavior normal.        Thought Content: Thought content normal.        Judgment: Judgment normal.         Assessment And Plan:    1. Encounter for health maintenance examination . Behavior modifications discussed and diet history reviewed.   . Pt will continue to exercise regularly and modify diet with low GI, plant based foods and decrease intake of processed foods.  . Recommend intake of daily multivitamin, Vitamin D, and calcium.  . Recommend mammogram and colonoscopy for preventive screenings, as well as recommend immunizations that include influenza,  TDAP . He is still thinking about the covid vaccine, information given from the CDC  2. Encounter for hepatitis C screening test for low risk patient - PSA - Hepatitis C antibody  3. Essential hypertension  Chronic, blood pressure has improved significantly   He has lost approximately 20 lbs since his last office visit  If this remains consistent will consider holding his HCTZ with an attempt to wean off his blood pressure medications - CBC - CMP14+EGFR - POCT Urinalysis Dipstick (81002) - POCT UA - Microalbumin - lisinopril (ZESTRIL) 10 MG tablet; Take 1 tablet (10 mg total) by mouth  daily.  Dispense: 90 tablet; Refill: 1  4. Elevated cholesterol  Chronic, no current medications  Diet controlled.  Pending results may need to consider a supplement or medication to improve - Lipid panel  5. BMI 30.0-30.9,adult  Congratulated on 20 lb weight loss since his last visit  Continue healthy diet and regular physical activity  6. COVID-19 virus vaccination declined  Declines covid 19 vaccine. Discussed risk of covid 57 and if he changes her mind about the vaccine to call the office.  Encouraged to take multivitamin, vitamin d, vitamin c and zinc to increase immune system. Aware can call office if would like to have vaccine here at office.     Patient was given opportunity to ask questions. Patient verbalized understanding of the plan and was able to repeat key elements of the plan. All questions were answered to their satisfaction.   Teola Bradley, FNP, have reviewed all documentation for this visit. The documentation on 12/19/19 for the exam, diagnosis, procedures, and orders are all accurate and complete.   THE PATIENT IS ENCOURAGED TO PRACTICE SOCIAL DISTANCING DUE TO THE COVID-19 PANDEMIC.

## 2019-12-18 NOTE — Patient Instructions (Addendum)

## 2019-12-19 LAB — CMP14+EGFR
ALT: 83 IU/L — ABNORMAL HIGH (ref 0–44)
AST: 38 IU/L (ref 0–40)
Albumin/Globulin Ratio: 1.5 (ref 1.2–2.2)
Albumin: 4.6 g/dL (ref 4.0–5.0)
Alkaline Phosphatase: 85 IU/L (ref 48–121)
BUN/Creatinine Ratio: 11 (ref 9–20)
BUN: 14 mg/dL (ref 6–20)
Bilirubin Total: 0.8 mg/dL (ref 0.0–1.2)
CO2: 27 mmol/L (ref 20–29)
Calcium: 9.7 mg/dL (ref 8.7–10.2)
Chloride: 96 mmol/L (ref 96–106)
Creatinine, Ser: 1.24 mg/dL (ref 0.76–1.27)
GFR calc Af Amer: 85 mL/min/{1.73_m2} (ref >59)
GFR calc non Af Amer: 73 mL/min/{1.73_m2} (ref >59)
Globulin, Total: 3 g/dL (ref 1.5–4.5)
Glucose: 78 mg/dL (ref 65–99)
Potassium: 3.8 mmol/L (ref 3.5–5.2)
Sodium: 137 mmol/L (ref 134–144)
Total Protein: 7.6 g/dL (ref 6.0–8.5)

## 2019-12-19 LAB — LIPID PANEL
Chol/HDL Ratio: 5.6 ratio — ABNORMAL HIGH (ref 0.0–5.0)
Cholesterol, Total: 185 mg/dL (ref 100–199)
HDL: 33 mg/dL — ABNORMAL LOW (ref >39)
LDL Chol Calc (NIH): 133 mg/dL — ABNORMAL HIGH (ref 0–99)
Triglycerides: 106 mg/dL (ref 0–149)
VLDL Cholesterol Cal: 19 mg/dL (ref 5–40)

## 2019-12-19 LAB — CBC
Hematocrit: 44.4 % (ref 37.5–51.0)
Hemoglobin: 14.6 g/dL (ref 13.0–17.7)
MCH: 27.9 pg (ref 26.6–33.0)
MCHC: 32.9 g/dL (ref 31.5–35.7)
MCV: 85 fL (ref 79–97)
Platelets: 279 10*3/uL (ref 150–450)
RBC: 5.23 x10E6/uL (ref 4.14–5.80)
RDW: 13.4 % (ref 11.6–15.4)
WBC: 5.6 10*3/uL (ref 3.4–10.8)

## 2019-12-19 LAB — HEPATITIS C ANTIBODY: Hep C Virus Ab: <0.1 s/co ratio (ref 0.0–0.9)

## 2019-12-19 LAB — PSA: Prostate Specific Ag, Serum: 1.2 ng/mL (ref 0.0–4.0)

## 2020-01-21 ENCOUNTER — Encounter: Payer: Self-pay | Admitting: Nurse Practitioner

## 2020-02-15 ENCOUNTER — Other Ambulatory Visit: Payer: Self-pay | Admitting: Family Medicine

## 2020-02-16 ENCOUNTER — Other Ambulatory Visit: Payer: Self-pay | Admitting: Nurse Practitioner

## 2020-02-16 DIAGNOSIS — I10 Essential (primary) hypertension: Secondary | ICD-10-CM
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
## 2020-03-04 DIAGNOSIS — K0889 Other specified disorders of teeth and supporting structures: Secondary | ICD-10-CM | POA: Diagnosis not present

## 2020-03-18 ENCOUNTER — Ambulatory Visit: Payer: BC Managed Care – PPO | Admitting: Nurse Practitioner

## 2020-03-18 ENCOUNTER — Encounter: Payer: Self-pay | Admitting: Nurse Practitioner

## 2020-03-18 ENCOUNTER — Other Ambulatory Visit: Payer: Self-pay

## 2020-03-18 VITALS — BP 140/96 | HR 73 | Temp 98.0°F | Ht 71.0 in | Wt 210.0 lb

## 2020-03-18 DIAGNOSIS — I10 Essential (primary) hypertension: Secondary | ICD-10-CM

## 2020-03-18 DIAGNOSIS — E7800 Pure hypercholesterolemia, unspecified: Secondary | ICD-10-CM

## 2020-03-18 DIAGNOSIS — G4733 Obstructive sleep apnea (adult) (pediatric): Secondary | ICD-10-CM

## 2020-03-18 LAB — CMP14+EGFR
ALT: 15 IU/L (ref 0–44)
AST: 20 IU/L (ref 0–40)
Albumin/Globulin Ratio: 1.6 (ref 1.2–2.2)
Albumin: 4.4 g/dL (ref 4.0–5.0)
Alkaline Phosphatase: 85 IU/L (ref 44–121)
BUN/Creatinine Ratio: 9 (ref 9–20)
BUN: 11 mg/dL (ref 6–20)
Bilirubin Total: 0.7 mg/dL (ref 0.0–1.2)
CO2: 27 mmol/L (ref 20–29)
Calcium: 9.8 mg/dL (ref 8.7–10.2)
Chloride: 98 mmol/L (ref 96–106)
Creatinine, Ser: 1.17 mg/dL (ref 0.76–1.27)
GFR calc Af Amer: 90 mL/min/{1.73_m2} (ref >59)
GFR calc non Af Amer: 78 mL/min/{1.73_m2} (ref >59)
Globulin, Total: 2.7 g/dL (ref 1.5–4.5)
Glucose: 78 mg/dL (ref 65–99)
Potassium: 3.9 mmol/L (ref 3.5–5.2)
Sodium: 138 mmol/L (ref 134–144)
Total Protein: 7.1 g/dL (ref 6.0–8.5)

## 2020-03-18 LAB — LIPID PANEL
Chol/HDL Ratio: 4.8 ratio (ref 0.0–5.0)
Cholesterol, Total: 205 mg/dL — ABNORMAL HIGH (ref 100–199)
HDL: 43 mg/dL (ref >39)
LDL Chol Calc (NIH): 151 mg/dL — ABNORMAL HIGH (ref 0–99)
Triglycerides: 59 mg/dL (ref 0–149)
VLDL Cholesterol Cal: 11 mg/dL (ref 5–40)

## 2020-03-18 NOTE — Patient Instructions (Addendum)

## 2020-03-18 NOTE — Progress Notes (Signed)
This visit occurred during the SARS-CoV-2 public health emergency.  Safety protocols were in place, including screening questions prior to the visit, additional usage of staff PPE, and extensive cleaning of exam room while observing appropriate contact time as indicated for disinfecting solutions.  Subjective:     Patient ID: Erik West , male    DOB: September 12, 1980 , 39 y.o.   MRN: [id number]   Chief Complaint  Patient presents with   Hypertension    HPI  Patient here for a f/u on his blood pressure.  Wt Readings from Last 3 Encounters: 03/18/20 : 210 lb (95.3 kg) 12/18/19 : 221 lb (100.2 kg) 09/30/19 : 243 lb (110.2 kg)  He is using a CPAP but since he has been losing weight he feels like he has more acid reflux and had stopped using temporary.    Hypertension This is a chronic problem. The current episode started more than 1 year ago. The problem is unchanged. The problem is uncontrolled. Pertinent negatives include no anxiety, chest pain, headaches, palpitations or shortness of breath. There are no associated agents to hypertension. Risk factors for coronary artery disease include male gender.     Past Medical History:  Diagnosis Date   Essential hypertension    Hypersomnia    Sleep apnea    diagnosed "maybe a year ago" uses CPAP     Family History  Problem Relation Age of Onset   Hypertension Paternal Grandmother    Cancer Other      Current Outpatient Medications:    cetirizine (ZYRTEC) 10 MG tablet, TAKE 1 TABLET BY MOUTH EVERY DAY, Disp: 90 tablet, Rfl: 0   D-5000 125 MCG (5000 UT) TABS, TAKE 1 TABLET BY MOUTH EVERY DAY, Disp: 90 tablet, Rfl: 3   hydrochlorothiazide (HYDRODIURIL) 25 MG tablet, Take 1 tablet (25 mg total) by mouth daily., Disp: 90 tablet, Rfl: 1   lisinopril (ZESTRIL) 10 MG tablet, Take 1 tablet (10 mg total) by mouth daily., Disp: 90 tablet, Rfl: 1   Magnesium 200 MG TABS, Take 1 tablet by mouth daily with evening meal, Disp: 30  tablet, Rfl: 2   No Known Allergies   Review of Systems  Constitutional: Negative.  Negative for fatigue.  Respiratory: Negative.  Negative for cough and shortness of breath.   Cardiovascular: Negative.  Negative for chest pain, palpitations and leg swelling.  Neurological: Negative for dizziness and headaches.  Psychiatric/Behavioral: Negative.      Today's Vitals   03/18/20 1613 03/18/20 1710  BP: (!) 138/100 (!) 140/96  Pulse: 73   Temp: 98 F (36.7 C)   Weight: 210 lb (95.3 kg)   Height: 5' 11"  (1.803 m)   PainSc: 4    PainLoc: Back    Body mass index is 29.29 kg/m.   Objective:  Physical Exam Constitutional:      General: He is not in acute distress.    Appearance: Normal appearance.  Cardiovascular:     Rate and Rhythm: Normal rate and regular rhythm.     Pulses: Normal pulses.     Heart sounds: Normal heart sounds. No murmur heard.   Pulmonary:     Effort: Pulmonary effort is normal. No respiratory distress.     Breath sounds: Normal breath sounds. No wheezing.  Skin:    Capillary Refill: Capillary refill takes less than 2 seconds.  Neurological:     General: No focal deficit present.     Mental Status: He is alert and oriented to person, place,  and time.  Psychiatric:        Mood and Affect: Mood normal.        Behavior: Behavior normal.        Thought Content: Thought content normal.        Judgment: Judgment normal.         Assessment And Plan:     1. Essential hypertension  Chronic, blood pressure remains elevated however is improved  I have spoken with him about not stopping his blood pressure medications at this time and there is a likelihood he will remain on for a lifetime with a family history of hypertension and his tendency to spike - CMP14+EGFR  2. Elevated cholesterol  Chronic, no current medications  Encouraged to avoid fried and fatty foods. - Lipid panel  3. OSA (obstructive sleep apnea)  Chronic, overall doing well and  continues to follow up with Neurololgy/sleep provider, he has lost a little bit of weight and may need changes made to his machine. I have given him the number to [facility] neurology to call about his BIPAP.    Patient was given opportunity to ask questions. Patient verbalized understanding of the plan and was able to repeat key elements of the plan. All questions were answered to their satisfaction.    Teola Bradley, FNP, have reviewed all documentation for this visit. The documentation on 03/22/20 for the exam, diagnosis, procedures, and orders are all accurate and complete.  THE PATIENT IS ENCOURAGED TO PRACTICE SOCIAL DISTANCING DUE TO THE COVID-19 PANDEMIC.

## 2020-03-22 DIAGNOSIS — E7800 Pure hypercholesterolemia, unspecified: Secondary | ICD-10-CM | POA: Insufficient documentation

## 2020-04-19 DIAGNOSIS — M542 Cervicalgia: Secondary | ICD-10-CM | POA: Diagnosis not present

## 2020-06-17 ENCOUNTER — Encounter: Payer: Self-pay | Admitting: Nurse Practitioner

## 2020-06-17 ENCOUNTER — Ambulatory Visit: Payer: BC Managed Care – PPO | Admitting: Nurse Practitioner

## 2020-06-17 ENCOUNTER — Other Ambulatory Visit: Payer: Self-pay

## 2020-06-17 VITALS — BP 140/100 | HR 78 | Temp 97.9°F | Ht 71.0 in | Wt 216.6 lb

## 2020-06-17 DIAGNOSIS — E7800 Pure hypercholesterolemia, unspecified: Secondary | ICD-10-CM

## 2020-06-17 DIAGNOSIS — Z6830 Body mass index (BMI) 30.0-30.9, adult: Secondary | ICD-10-CM

## 2020-06-17 DIAGNOSIS — G4733 Obstructive sleep apnea (adult) (pediatric): Secondary | ICD-10-CM | POA: Diagnosis not present

## 2020-06-17 DIAGNOSIS — I10 Essential (primary) hypertension: Secondary | ICD-10-CM | POA: Diagnosis not present

## 2020-06-17 MED ORDER — AMLODIPINE BESYLATE 2.5 MG PO TABS: 2.5000 mg | ORAL_TABLET | Freq: Every day | ORAL | 2 refills | Status: DC

## 2020-06-17 NOTE — Patient Instructions (Addendum)
Hypertension, Adult Hypertension is another name for high blood pressure. High blood pressure forces your heart to work harder to pump blood. This can cause problems over time. There are two numbers in a blood pressure reading. There is a top number (systolic) over a bottom number (diastolic). It is best to have a blood pressure that is below 120/80. Healthy choices can help lower your blood pressure, or you may need medicine to help lower it. What are the causes? The cause of this condition is not known. Some conditions may be related to high blood pressure. What increases the risk?  Smoking.  Having type 2 diabetes mellitus, high cholesterol, or both.  Not getting enough exercise or physical activity.  Being overweight.  Having too much fat, sugar, calories, or salt (sodium) in your diet.  Drinking too much alcohol.  Having long-term (chronic) kidney disease.  Having a family history of high blood pressure.  Age. Risk increases with age.  Race. You may be at higher risk if you are African American.  Gender. Men are at higher risk than women before age 45. After age 65, women are at higher risk than men.  Having obstructive sleep apnea.  Stress. What are the signs or symptoms?  High blood pressure may not cause symptoms. Very high blood pressure (hypertensive crisis) may cause: ? Headache. ? Feelings of worry or nervousness (anxiety). ? Shortness of breath. ? Nosebleed. ? A feeling of being sick to your stomach (nausea). ? Throwing up (vomiting). ? Changes in how you see. ? Very bad chest pain. ? Seizures. How is this treated?  This condition is treated by making healthy lifestyle changes, such as: ? Eating healthy foods. ? Exercising more. ? Drinking less alcohol.  Your health care provider may prescribe medicine if lifestyle changes are not enough to get your blood pressure under control, and if: ? Your top number is above 130. ? Your bottom number is above  80.  Your personal target blood pressure may vary. Follow these instructions at home: Eating and drinking  If told, follow the DASH eating plan. To follow this plan: ? Fill one half of your plate at each meal with fruits and vegetables. ? Fill one fourth of your plate at each meal with whole grains. Whole grains include whole-wheat pasta, brown rice, and whole-grain bread. ? Eat or drink low-fat dairy products, such as skim milk or low-fat yogurt. ? Fill one fourth of your plate at each meal with low-fat (lean) proteins. Low-fat proteins include fish, chicken without skin, eggs, beans, and tofu. ? Avoid fatty meat, cured and processed meat, or chicken with skin. ? Avoid pre-made or processed food.  Eat less than 1,500 mg of salt each day.  Do not drink alcohol if: ? Your doctor tells you not to drink. ? You are pregnant, may be pregnant, or are planning to become pregnant.  If you drink alcohol: ? Limit how much you use to:  0-1 drink a day for women.  0-2 drinks a day for men. ? Be aware of how much alcohol is in your drink. In the [place] one drink equals one 12 oz bottle of beer (355 mL), one 5 oz glass of wine (148 mL), or one 1 oz glass of hard liquor (44 mL).   Lifestyle  Work with your doctor to stay at a healthy weight or to lose weight. Ask your doctor what the best weight is for you.  Get at least 30 minutes of exercise most   days of the week. This may include walking, swimming, or biking.  Get at least 30 minutes of exercise that strengthens your muscles (resistance exercise) at least 3 days a week. This may include lifting weights or doing Pilates.  Do not use any products that contain nicotine or tobacco, such as cigarettes, e-cigarettes, and chewing tobacco. If you need help quitting, ask your doctor.  Check your blood pressure at home as told by your doctor.  Keep all follow-up visits as told by your doctor. This is important.   Medicines  Take over-the-counter  and prescription medicines only as told by your doctor. Follow directions carefully.  Do not skip doses of blood pressure medicine. The medicine does not work as well if you skip doses. Skipping doses also puts you at risk for problems.  Ask your doctor about side effects or reactions to medicines that you should watch for. Contact a doctor if you:  Think you are having a reaction to the medicine you are taking.  Have headaches that keep coming back (recurring).  Feel dizzy.  Have swelling in your ankles.  Have trouble with your vision. Get help right away if you:  Get a very bad headache.  Start to feel mixed up (confused).  Feel weak or numb.  Feel faint.  Have very bad pain in your: ? Chest. ? Belly (abdomen).  Throw up more than once.  Have trouble breathing. Summary  Hypertension is another name for high blood pressure.  High blood pressure forces your heart to work harder to pump blood.  For most people, a normal blood pressure is less than 120/80.  Making healthy choices can help lower blood pressure. If your blood pressure does not get lower with healthy choices, you may need to take medicine. This information is not intended to replace advice given to you by your health care provider. Make sure you discuss any questions you have with your health care provider. Document Revised: 12/05/2017 Document Reviewed: 12/05/2017 Elsevier Patient Education  2021 [employer] Inc[employer]   Increase your fiber intake by eating more green leafy vegetables or take a fiber supplement High Cholesterol  High cholesterol is a condition in which the blood has high levels of a white, waxy substance similar to fat (cholesterol). The liver makes all the cholesterol that the body needs. The human body needs small amounts of cholesterol to help build cells. A person gets extra or excess cholesterol from the food that he or she eats. The blood carries cholesterol from the liver to the rest of  the body. If you have high cholesterol, deposits (plaques) may build up on the walls of your arteries. Arteries are the blood vessels that carry blood away from your heart. These plaques make the arteries narrow and stiff. Cholesterol plaques increase your risk for heart attack and stroke. Work with your health care provider to keep your cholesterol levels in a healthy range. What increases the risk? The following factors may make you more likely to develop this condition:  Eating foods that are high in animal fat (saturated fat) or cholesterol.  Being overweight.  Not getting enough exercise.  A family history of high cholesterol (familial hypercholesterolemia).  Use of tobacco products.  Having diabetes. What are the signs or symptoms? There are no symptoms of this condition. How is this diagnosed? This condition may be diagnosed based on the results of a blood test.  If you are older than 40 years of age, your health care provider may check  your cholesterol levels every 4-6 years.  You may be checked more often if you have high cholesterol or other risk factors for heart disease. The blood test for cholesterol measures:  "Bad" cholesterol, or LDL cholesterol. This is the main type of cholesterol that causes heart disease. The desired level is less than 100 mg/dL.  "Good" cholesterol, or HDL cholesterol. HDL helps protect against heart disease by cleaning the arteries and carrying the LDL to the liver for processing. The desired level for HDL is 60 mg/dL or higher.  Triglycerides. These are fats that your body can store or burn for energy. The desired level is less than 150 mg/dL.  Total cholesterol. This measures the total amount of cholesterol in your blood and includes LDL, HDL, and triglycerides. The desired level is less than 200 mg/dL. How is this treated? This condition may be treated with:  Diet changes. You may be asked to eat foods that have more fiber and less  saturated fats or added sugar.  Lifestyle changes. These may include regular exercise, maintaining a healthy weight, and quitting use of tobacco products.  Medicines. These are given when diet and lifestyle changes have not worked. You may be prescribed a statin medicine to help lower your cholesterol levels. Follow these instructions at home: Eating and drinking  Eat a healthy, balanced diet. This diet includes: ? Daily servings of a variety of fresh, frozen, or canned fruits and vegetables. ? Daily servings of whole grain foods that are rich in fiber. ? Foods that are low in saturated fats and trans fats. These include poultry and fish without skin, lean cuts of meat, and low-fat dairy products. ? A variety of fish, especially oily fish that contain omega-3 fatty acids. Aim to eat fish at least 2 times a week.  Avoid foods and drinks that have added sugar.  Use healthy cooking methods, such as roasting, grilling, broiling, baking, poaching, steaming, and stir-frying. Do not fry your food except for stir-frying.   Lifestyle  Get regular exercise. Aim to exercise for a total of 150 minutes a week. Increase your activity level by doing activities such as gardening, walking, and taking the stairs.  Do not use any products that contain nicotine or tobacco, such as cigarettes, e-cigarettes, and chewing tobacco. If you need help quitting, ask your health care provider.   General instructions  Take over-the-counter and prescription medicines only as told by your health care provider.  Keep all follow-up visits as told by your health care provider. This is important. Where to find more information  American Heart Association: www.heart.org  National Heart, Lung, and Blood Institute: [employer]is Contact a health care provider if:  You have trouble achieving or maintaining a healthy diet or weight.  You are starting an exercise program.  You are unable to stop smoking. Get help  right away if:  You have chest pain.  You have trouble breathing.  You have any symptoms of a stroke. "BE FAST" is an easy way to remember the main warning signs of a stroke: ? B - Balance. Signs are dizziness, sudden trouble walking, or loss of balance. ? E - Eyes. Signs are trouble seeing or a sudden change in vision. ? F - Face. Signs are sudden weakness or numbness of the face, or the face or eyelid drooping on one side. ? A - Arms. Signs are weakness or numbness in an arm. This happens suddenly and usually on one side of the body. ? S - Speech.  Signs are sudden trouble speaking, slurred speech, or trouble understanding what people say. ? T - Time. Time to call emergency services. Write down what time symptoms started.  You have other signs of a stroke, such as: ? A sudden, severe headache with no known cause. ? Nausea or vomiting. ? Seizure. These symptoms may represent a serious problem that is an emergency. Do not wait to see if the symptoms will go away. Get medical help right away. Call your local emergency services (911 in the [place] Do not drive yourself to the hospital. Summary  Cholesterol plaques increase your risk for heart attack and stroke. Work with your health care provider to keep your cholesterol levels in a healthy range.  Eat a healthy, balanced diet, get regular exercise, and maintain a healthy weight.  Do not use any products that contain nicotine or tobacco, such as cigarettes, e-cigarettes, and chewing tobacco.  Get help right away if you have any symptoms of a stroke. This information is not intended to replace advice given to you by your health care provider. Make sure you discuss any questions you have with your health care provider. Document Revised: 02/24/2019 Document Reviewed: 02/24/2019 Elsevier Patient Education  2021 [employer].

## 2020-06-17 NOTE — Progress Notes (Signed)
I,Yamilka Roman [employer] as a Education administrator for [employer], FNP.,have documented all relevant documentation on the behalf of [name], FNP,as directed by  [name], FNP while in the presence of [name], [place]. This visit occurred during the SARS-CoV-2 public health emergency.  Safety protocols were in place, including screening questions prior to the visit, additional usage of staff PPE, and extensive cleaning of exam room while observing appropriate contact time as indicated for disinfecting solutions.  Subjective:     Patient ID: Erik West , male    DOB: 07-Mar-1981 , 40 y.o.   MRN: [id number]   Chief Complaint  Patient presents with   Hypertension    HPI  Patient presents today for a blood pressure.  Wt Readings from Last 3 Encounters: 06/17/20 : 216 lb 9.6 oz (98.2 kg) 03/18/20 : 210 lb (95.3 kg) 12/18/19 : 221 lb (100.2 kg)  Hypertension This is a chronic problem. The current episode started more than 1 year ago. The problem has been gradually worsening since onset. The problem is uncontrolled. Pertinent negatives include no anxiety, blurred vision, chest pain, palpitations or peripheral edema. Risk factors for coronary artery disease include sedentary lifestyle and obesity. Past treatments include diuretics and ACE inhibitors. There are no compliance problems.      Past Medical History:  Diagnosis Date   Essential hypertension    Hypersomnia    Sleep apnea    diagnosed "maybe a year ago" uses CPAP     Family History  Problem Relation Age of Onset   Hypertension Paternal Grandmother    Cancer Other      Current Outpatient Medications:    amLODipine (NORVASC) 2.5 MG tablet, Take 1 tablet (2.5 mg total) by mouth daily., Disp: 30 tablet, Rfl: 2   cetirizine (ZYRTEC) 10 MG tablet, TAKE 1 TABLET BY MOUTH EVERY DAY, Disp: 90 tablet, Rfl: 0   CREATINE PO, Take by mouth., Disp: , Rfl:    D-5000 125 MCG (5000 UT) TABS, TAKE 1 TABLET BY MOUTH EVERY DAY, Disp:  90 tablet, Rfl: 3   hydrochlorothiazide (HYDRODIURIL) 25 MG tablet, Take 1 tablet (25 mg total) by mouth daily., Disp: 90 tablet, Rfl: 1   lisinopril (ZESTRIL) 10 MG tablet, Take 1 tablet (10 mg total) by mouth daily., Disp: 90 tablet, Rfl: 1   Magnesium 200 MG TABS, Take 1 tablet by mouth daily with evening meal, Disp: 30 tablet, Rfl: 2   Omega-3 Fatty Acids (FISH OIL) 1000 MG CAPS, Take by mouth., Disp: , Rfl:    vitamin C (ASCORBIC ACID) 500 MG tablet, Take by mouth daily., Disp: , Rfl:    No Known Allergies   Review of Systems  Constitutional: Negative.   HENT: Negative.   Eyes: Negative.  Negative for blurred vision.  Respiratory: Negative.   Cardiovascular: Negative.  Negative for chest pain, palpitations and leg swelling.  Gastrointestinal: Negative.   Endocrine: Negative.   Genitourinary: Negative.   Musculoskeletal: Negative.   Skin: Negative.   Neurological: Negative.   Hematological: Negative.   Psychiatric/Behavioral: Negative.      Today's Vitals   06/17/20 1609  BP: (!) 140/100  Pulse: 78  Temp: 97.9 F (36.6 C)  TempSrc: Oral  Weight: 216 lb 9.6 oz (98.2 kg)  Height: 5' 11"  (1.803 m)  PainSc: 0-No pain   Body mass index is 30.21 kg/m.   Objective:  Physical Exam Constitutional:      General: He is not in acute distress.    Appearance: Normal  appearance. He is obese.  Cardiovascular:     Rate and Rhythm: Normal rate and regular rhythm.     Pulses: Normal pulses.     Heart sounds: Normal heart sounds.  Pulmonary:     Effort: Pulmonary effort is normal. No respiratory distress.     Breath sounds: Normal breath sounds. No wheezing.  Abdominal:     General: Abdomen is flat. Bowel sounds are normal.     Palpations: Abdomen is soft.  Musculoskeletal:     Cervical back: Normal range of motion and neck supple.  Skin:    General: Skin is warm and dry.     Capillary Refill: Capillary refill takes less than 2 seconds.  Neurological:     General: No focal  deficit present.     Mental Status: He is alert and oriented to person, place, and time.  Psychiatric:        Mood and Affect: Mood normal.        Behavior: Behavior normal.        Thought Content: Thought content normal.        Judgment: Judgment normal.         Assessment And Plan:     1. Uncontrolled hypertension His blood pressure continues to be elevated in spite of eating a low salt diet and taking his HCTZ I will add amlodipine 2.5 mg daily and he will return for a 2 week nurse visit f/u - amLODipine (NORVASC) 2.5 MG tablet; Take 1 tablet (2.5 mg total) by mouth daily.  Dispense: 30 tablet; Refill: 2 - BMP8+eGFR  2. OSA (obstructive sleep apnea) Chronic, doing well with his CPAP and feels that it has improved his quality of life. 3. Elevated cholesterol Chronic, no current medications Will check lipid panel  Pending results will start an anticholesterol medication - Lipid panel  4. BMI 30.0-30.9,adult Chronic Discussed healthy diet and regular exercise options  Encouraged to exercise at least 150 minutes per week with 2 days of strength training He is encouraged to strive for BMI less than 30 to decrease cardiac risk. Advised to aim for at least 150 minutes of exercise per week.      Patient was given opportunity to ask questions. Patient verbalized understanding of the plan and was able to repeat key elements of the plan. All questions were answered to their satisfaction.  [name], FNP   I, [name], FNP, have reviewed all documentation for this visit. The documentation on 06/17/20 for the exam, diagnosis, procedures, and orders are all accurate and complete.   THE PATIENT IS ENCOURAGED TO PRACTICE SOCIAL DISTANCING DUE TO THE COVID-19 PANDEMIC.

## 2020-06-18 LAB — LIPID PANEL
Chol/HDL Ratio: 4.9 ratio (ref 0.0–5.0)
Cholesterol, Total: 205 mg/dL — ABNORMAL HIGH (ref 100–199)
HDL: 42 mg/dL (ref >39)
LDL Chol Calc (NIH): 152 mg/dL — ABNORMAL HIGH (ref 0–99)
Triglycerides: 62 mg/dL (ref 0–149)
VLDL Cholesterol Cal: 11 mg/dL (ref 5–40)

## 2020-06-18 LAB — BMP8+EGFR
BUN/Creatinine Ratio: 9 (ref 9–20)
BUN: 11 mg/dL (ref 6–20)
CO2: 25 mmol/L (ref 20–29)
Calcium: 9.6 mg/dL (ref 8.7–10.2)
Chloride: 100 mmol/L (ref 96–106)
Creatinine, Ser: 1.16 mg/dL (ref 0.76–1.27)
Glucose: 76 mg/dL (ref 65–99)
Potassium: 3.9 mmol/L (ref 3.5–5.2)
Sodium: 140 mmol/L (ref 134–144)
eGFR: 82 mL/min/{1.73_m2} (ref >59)

## 2020-07-15 ENCOUNTER — Encounter: Payer: Self-pay | Admitting: Nurse Practitioner

## 2020-07-15 ENCOUNTER — Ambulatory Visit: Payer: BC Managed Care – PPO | Admitting: Nurse Practitioner

## 2020-07-15 ENCOUNTER — Other Ambulatory Visit: Payer: Self-pay

## 2020-07-15 VITALS — BP 122/78 | HR 103 | Temp 99.5°F | Ht 70.2 in | Wt 215.6 lb

## 2020-07-15 DIAGNOSIS — J302 Other seasonal allergic rhinitis: Secondary | ICD-10-CM | POA: Diagnosis not present

## 2020-07-15 DIAGNOSIS — I10 Essential (primary) hypertension: Secondary | ICD-10-CM | POA: Diagnosis not present

## 2020-07-15 DIAGNOSIS — Z1152 Encounter for screening for COVID-19: Secondary | ICD-10-CM

## 2020-07-15 NOTE — Progress Notes (Signed)
This visit occurred during the SARS-CoV-2 public health emergency.  Safety protocols were in place, including screening questions prior to the visit, additional usage of staff PPE, and extensive cleaning of exam room while observing appropriate contact time as indicated for disinfecting solutions.  Subjective:     Patient ID: Erik West , male    DOB: 1980-12-01 , 40 y.o.   MRN: [id number]   Chief Complaint  Patient presents with  . Hypertension    HPI  Erik West presents for a hypertension medication follow-up.  He was started on amlodipine on 06/17/2020.  His blood pressure today is down to 122/78.  He reports medication compliance and denies any side effects.  He does not check blood pressure at home because he cannot find his cuff.    He reports taking red yeast rice supplements for cholesterol.  He has started working out and is intermittent fasting.  He is following a low carb, high protein diet and drinking an adequate amount of water.    [name] reports seasonal allergies with sneezing, coughing, and occasional dyspnea.  He is taking cetirizine PRN.  He reports seeing an ENT last year that recommended a tonsillectomy.  He would like to have surgery soon.    Wt Readings from Last 3 Encounters: 07/15/20 : 215 lb 9.6 oz (97.8 kg) 06/17/20 : 216 lb 9.6 oz (98.2 kg) 03/18/20 : 210 lb (95.3 kg)     Past Medical History:  Diagnosis Date  . Essential hypertension   . Hypersomnia   . Sleep apnea    diagnosed "maybe a year ago" uses CPAP     Family History  Problem Relation Age of Onset  . Hypertension Paternal Grandmother   . Cancer Other      Current Outpatient Medications:  .  amLODipine (NORVASC) 2.5 MG tablet, Take 1 tablet (2.5 mg total) by mouth daily., Disp: 30 tablet, Rfl: 2 .  cetirizine (ZYRTEC) 10 MG tablet, TAKE 1 TABLET BY MOUTH EVERY DAY, Disp: 90 tablet, Rfl: 0 .  CREATINE PO, Take by mouth., Disp: , Rfl:  .  D-5000 125 MCG (5000 UT) TABS, TAKE 1  TABLET BY MOUTH EVERY DAY, Disp: 90 tablet, Rfl: 3 .  hydrochlorothiazide (HYDRODIURIL) 25 MG tablet, Take 1 tablet (25 mg total) by mouth daily., Disp: 90 tablet, Rfl: 1 .  lisinopril (ZESTRIL) 10 MG tablet, Take 1 tablet (10 mg total) by mouth daily., Disp: 90 tablet, Rfl: 1 .  Magnesium 200 MG TABS, Take 1 tablet by mouth daily with evening meal, Disp: 30 tablet, Rfl: 2 .  Omega-3 Fatty Acids (FISH OIL) 1000 MG CAPS, Take by mouth., Disp: , Rfl:  .  vitamin C (ASCORBIC ACID) 500 MG tablet, Take by mouth daily., Disp: , Rfl:  .  albuterol (VENTOLIN HFA) 108 (90 Base) MCG/ACT inhaler, Inhale 2 puffs into the lungs every 6 (six) hours as needed for wheezing or shortness of breath., Disp: 8 g, Rfl: 2 .  azithromycin (ZITHROMAX) 250 MG tablet, Take 2 tablets (500 mg) on  Day 1,  followed by 1 tablet (250 mg) once daily on Days 2 through 5., Disp: 6 each, Rfl: 0 .  promethazine-dextromethorphan (PROMETHAZINE-DM) 6.25-15 MG/5ML syrup, Take 2.5 mLs by mouth 4 (four) times daily as needed for cough., Disp: 118 mL, Rfl: 0   No Known Allergies   Review of Systems  Constitutional: Negative.   Eyes: Positive for itching. Negative for visual disturbance.  Respiratory: Negative.  Negative for cough.  Cardiovascular: Negative.  Negative for chest pain, palpitations and leg swelling.  Allergic/Immunologic: Positive for environmental allergies.  Neurological: Negative for dizziness and headaches.  Psychiatric/Behavioral: Negative.      Today's Vitals   07/15/20 1619  BP: 122/78  Pulse: (!) 103  Temp: 99.5 F (37.5 C)  TempSrc: Oral  Weight: 215 lb 9.6 oz (97.8 kg)  Height: 5' 10.2" (1.783 m)  PainSc: 0-No pain   Body mass index is 30.76 kg/m.   Objective:  Physical Exam Constitutional:      General: He is not in acute distress.    Appearance: Normal appearance. He is obese.  HENT:     Head: Normocephalic.     Right Ear: Tympanic membrane, ear canal and external ear normal. There is no  impacted cerumen.     Left Ear: Tympanic membrane, ear canal and external ear normal. There is no impacted cerumen.  Eyes:     Conjunctiva/sclera: Conjunctivae normal.     Pupils: Pupils are equal, round, and reactive to light.  Cardiovascular:     Rate and Rhythm: Normal rate and regular rhythm.     Pulses: Normal pulses.     Heart sounds: Normal heart sounds. No murmur heard.   Pulmonary:     Effort: Pulmonary effort is normal. No respiratory distress.     Breath sounds: Normal breath sounds. No wheezing.  Skin:    General: Skin is warm and dry.     Coloration: Skin is not jaundiced.  Neurological:     General: No focal deficit present.     Mental Status: He is alert and oriented to person, place, and time.     Cranial Nerves: No cranial nerve deficit.     Motor: No weakness.  Psychiatric:        Mood and Affect: Mood normal.        Behavior: Behavior normal.        Thought Content: Thought content normal.        Judgment: Judgment normal.         Assessment And Plan:     1. Essential hypertension  His blood pressure is much better controlled  Continue with current medications and diet restrictions limiting salt intake.   2. Encounter for screening for COVID-19 - Novel Coronavirus, [facility] ([facility])  3. Seasonal allergies  Will check for covid due to the symptoms  Encouraged to take an over the counter antihistamine during the peak season     Patient was given opportunity to ask questions. Patient verbalized understanding of the plan and was able to repeat key elements of the plan. All questions were answered to their satisfaction.  [name], FNP   I, [name], FNP, have reviewed all documentation for this visit. The documentation on 07/15/20 for the exam, diagnosis, procedures, and orders are all accurate and complete.   IF YOU HAVE BEEN REFERRED TO A SPECIALIST, IT MAY TAKE 1-2 WEEKS TO SCHEDULE/PROCESS THE REFERRAL. IF YOU HAVE NOT HEARD FROM  US/SPECIALIST IN TWO WEEKS, PLEASE GIVE [place] A CALL AT [phone].   THE PATIENT IS ENCOURAGED TO PRACTICE SOCIAL DISTANCING DUE TO THE COVID-19 PANDEMIC.

## 2020-07-15 NOTE — Progress Notes (Deleted)
  I,Brandin Stetzer [employer] as a Neurosurgeon for [employer], FNP.,have documented all relevant documentation on the behalf of [name], FNP,as directed by  [name], FNP while in the presence of [name], FNP. This visit occurred during the SARS-CoV-2 public health emergency.  Safety protocols were in place, including screening questions prior to the visit, additional usage of staff PPE, and extensive cleaning of exam room while observing appropriate contact time as indicated for disinfecting solutions.  Subjective:     Patient ID: Erik West , male    DOB: 13-Sep-1980 , 40 y.o.   MRN: [id number]   No chief complaint on file.   HPI  Patient presents today for a blood pressure f/u. He was started on amlodipine a month ago and is tolerating it well.     Past Medical History:  Diagnosis Date  . Essential hypertension   . Hypersomnia   . Sleep apnea    diagnosed "maybe a year ago" uses CPAP     Family History  Problem Relation Age of Onset  . Hypertension Paternal Grandmother   . Cancer Other      Current Outpatient Medications:  .  amLODipine (NORVASC) 2.5 MG tablet, Take 1 tablet (2.5 mg total) by mouth daily., Disp: 30 tablet, Rfl: 2 .  cetirizine (ZYRTEC) 10 MG tablet, TAKE 1 TABLET BY MOUTH EVERY DAY, Disp: 90 tablet, Rfl: 0 .  CREATINE PO, Take by mouth., Disp: , Rfl:  .  D-5000 125 MCG (5000 UT) TABS, TAKE 1 TABLET BY MOUTH EVERY DAY, Disp: 90 tablet, Rfl: 3 .  hydrochlorothiazide (HYDRODIURIL) 25 MG tablet, Take 1 tablet (25 mg total) by mouth daily., Disp: 90 tablet, Rfl: 1 .  lisinopril (ZESTRIL) 10 MG tablet, Take 1 tablet (10 mg total) by mouth daily., Disp: 90 tablet, Rfl: 1 .  Magnesium 200 MG TABS, Take 1 tablet by mouth daily with evening meal, Disp: 30 tablet, Rfl: 2 .  Omega-3 Fatty Acids (FISH OIL) 1000 MG CAPS, Take by mouth., Disp: , Rfl:  .  vitamin C (ASCORBIC ACID) 500 MG tablet, Take by mouth daily., Disp: , Rfl:    No Known Allergies    Review of Systems   Today's Vitals   07/15/20 1619  Temp: 99.5 F (37.5 C)  Weight: 215 lb 9.6 oz (97.8 kg)  Height: 5' 10.2" (1.783 m)   Body mass index is 30.76 kg/m.   Objective:  Physical Exam      Assessment And Plan:     There are no diagnoses linked to this encounter.    Patient was given opportunity to ask questions. Patient verbalized understanding of the plan and was able to repeat key elements of the plan. All questions were answered to their satisfaction.  [address] [place], CMA   I, [place], [place], have reviewed all documentation for this visit. The documentation on 07/15/20 for the exam, diagnosis, procedures, and orders are all accurate and complete.   IF YOU HAVE BEEN REFERRED TO A SPECIALIST, IT MAY TAKE 1-2 WEEKS TO SCHEDULE/PROCESS THE REFERRAL. IF YOU HAVE NOT HEARD FROM US/SPECIALIST IN TWO WEEKS, PLEASE GIVE [place] A CALL AT [phone].   THE PATIENT IS ENCOURAGED TO PRACTICE SOCIAL DISTANCING DUE TO THE COVID-19 PANDEMIC.

## 2020-07-16 ENCOUNTER — Encounter: Payer: Self-pay | Admitting: Nurse Practitioner

## 2020-07-16 LAB — SPECIMEN STATUS REPORT

## 2020-07-16 LAB — NOVEL CORONAVIRUS, NAA: SARS-CoV-2, NAA: NOT DETECTED

## 2020-07-16 LAB — SARS-COV-2, NAA 2 DAY TAT

## 2020-08-04 ENCOUNTER — Telehealth (INDEPENDENT_AMBULATORY_CARE_PROVIDER_SITE_OTHER): Payer: BC Managed Care – PPO | Admitting: Nurse Practitioner

## 2020-08-04 ENCOUNTER — Encounter: Payer: Self-pay | Admitting: Nurse Practitioner

## 2020-08-04 ENCOUNTER — Other Ambulatory Visit: Payer: Self-pay

## 2020-08-04 VITALS — BP 143/88 | HR 75 | Temp 98.0°F | Ht 70.2 in | Wt 215.0 lb

## 2020-08-04 DIAGNOSIS — R059 Cough, unspecified: Secondary | ICD-10-CM

## 2020-08-04 DIAGNOSIS — R509 Fever, unspecified: Secondary | ICD-10-CM

## 2020-08-04 DIAGNOSIS — R062 Wheezing: Secondary | ICD-10-CM | POA: Diagnosis not present

## 2020-08-04 DIAGNOSIS — Z20822 Contact with and (suspected) exposure to covid-19: Secondary | ICD-10-CM

## 2020-08-04 LAB — POC COVID19 BINAXNOW: SARS Coronavirus 2 Ag: POSITIVE — AB

## 2020-08-04 MED ORDER — ALBUTEROL SULFATE HFA 108 (90 BASE) MCG/ACT IN AERS: 2.0000 | INHALATION_SPRAY | Freq: Four times a day (QID) | RESPIRATORY_TRACT | 2 refills | Status: DC | PRN

## 2020-08-04 MED ORDER — AZITHROMYCIN 250 MG PO TABS: ORAL_TABLET | ORAL | 0 refills | Status: AC

## 2020-08-04 MED ORDER — PROMETHAZINE-DM 6.25-15 MG/5ML PO SYRP: 2.5000 mL | ORAL_SOLUTION | Freq: Four times a day (QID) | ORAL | 0 refills | Status: DC | PRN

## 2020-08-04 NOTE — Progress Notes (Signed)
Virtual Visit via virtual visit.    This visit type was conducted due to national recommendations for restrictions regarding the COVID-19 Pandemic (e.g. social distancing) in an effort to limit this patient's exposure and mitigate transmission in our community.  Due to his co-morbid illnesses, this patient is at least at moderate risk for complications without adequate follow up.  This format is felt to be most appropriate for this patient at this time.  All issues noted in this document were discussed and addressed.  A limited physical exam was performed with this format.    This visit type was conducted due to national recommendations for restrictions regarding the COVID-19 Pandemic (e.g. social distancing) in an effort to limit this patient's exposure and mitigate transmission in our community.  Patients identity confirmed using two different identifiers.  This format is felt to be most appropriate for this patient at this time.  All issues noted in this document were discussed and addressed.  No physical exam was performed (except for noted visual exam findings with Video Visits).    Date:  08/04/2020   ID:  Erik West, DOB 02-22-1981, MRN [id number]  Patient Location:  Home   Provider location:   Office    Chief Complaint:  Fever, chills, cough with nasal congestion. Wheezing at times. Bodyaches.   History of Present Illness:    Erik West is a 40 y.o. male who presents via video conferencing for a telehealth visit today.     He has had a fever for a few days now. It comes and goes along with the fever he also has bodyaches for a couple of days. He has taken tylenol. He is coughing, congestion, sneezing, runny nose. Coughing up mucus. He has shortness of breath at times when he is coughing. No chest pain. He will come in the parking lot to get tested for Covid.   Fever  Associated symptoms include congestion, coughing, headaches and wheezing. Pertinent negatives include  no chest pain or nausea.     Past Medical History:  Diagnosis Date  . Essential hypertension   . Hypersomnia   . Sleep apnea    diagnosed "maybe a year ago" uses CPAP   Past Surgical History:  Procedure Laterality Date  . ANTERIOR CERVICAL DECOMPRESSION/DISCECTOMY FUSION 4 LEVELS N/A 07/03/2019   Procedure: ANTERIOR CERVICAL DECOMPRESSION FUSION CERVICAL 4-5, CERVICAL 5-6, CERVICAL 6-7 WITH INSTRUMENTATION AND ALLOGRAFT;  Surgeon: Estill Bamberg, MD;  Location: [facility] OR;  Service: Orthopedics;  Laterality: N/A;     Current Meds  Medication Sig  . amLODipine (NORVASC) 2.5 MG tablet Take 1 tablet (2.5 mg total) by mouth daily.  . cetirizine (ZYRTEC) 10 MG tablet TAKE 1 TABLET BY MOUTH EVERY DAY  . CREATINE PO Take by mouth.  . D-5000 125 MCG (5000 UT) TABS TAKE 1 TABLET BY MOUTH EVERY DAY  . hydrochlorothiazide (HYDRODIURIL) 25 MG tablet Take 1 tablet (25 mg total) by mouth daily.  Marland Kitchen lisinopril (ZESTRIL) 10 MG tablet Take 1 tablet (10 mg total) by mouth daily.  . Magnesium 200 MG TABS Take 1 tablet by mouth daily with evening meal  . Omega-3 Fatty Acids (FISH OIL) 1000 MG CAPS Take by mouth.  . vitamin C (ASCORBIC ACID) 500 MG tablet Take by mouth daily.     Allergies:   Patient has no known allergies.   Social History   Tobacco Use  . Smoking status: Never Smoker  . Smokeless tobacco: Never Used  Vaping Use  .  Vaping Use: Never used  Substance Use Topics  . Alcohol use: Yes    Comment: occ  . Drug use: Never     Family Hx: The patient's family history includes Cancer in an other family member; Hypertension in his paternal grandmother.  ROS:   Please see the history of present illness.    Review of Systems  Constitutional: Positive for chills and fever.  HENT: Positive for congestion.   Respiratory: Positive for cough, sputum production and wheezing.   Cardiovascular: Negative for chest pain and palpitations.  Gastrointestinal: Negative for constipation and nausea.   Musculoskeletal: Positive for myalgias.  Neurological: Positive for headaches.    All other systems reviewed and are negative.   Labs/Other Tests and Data Reviewed:    Recent Labs: 12/18/2019: Hemoglobin 14.6; Platelets 279 03/18/2020: ALT 15 06/17/2020: BUN 11; Creatinine, Ser 1.16; Potassium 3.9; Sodium 140   Recent Lipid Panel Lab Results  Component Value Date/Time   CHOL 205 (H) 06/17/2020 04:49 PM   TRIG 62 06/17/2020 04:49 PM   HDL 42 06/17/2020 04:49 PM   CHOLHDL 4.9 06/17/2020 04:49 PM   LDLCALC 152 (H) 06/17/2020 04:49 PM    Wt Readings from Last 3 Encounters:  08/04/20 215 lb (97.5 kg)  07/15/20 215 lb 9.6 oz (97.8 kg)  06/17/20 216 lb 9.6 oz (98.2 kg)     Exam:    Vital Signs:  BP (!) 143/88 (BP Location: Left Arm, Patient Position: Sitting, Cuff Size: Large)   Pulse 75   Temp 98 F (36.7 C) (Oral)   Ht 5' 10.2" (1.783 m)   Wt 215 lb (97.5 kg)   BMI 30.67 kg/m     Physical Exam Vitals and nursing note reviewed.  HENT:     Head: Normocephalic and atraumatic.  Pulmonary:     Effort: Pulmonary effort is normal.  Neurological:     Mental Status: He is alert and oriented to person, place, and time.  Psychiatric:        Mood and Affect: Affect normal.     ASSESSMENT & PLAN:   1) Suspected  COVID-19 virus infection -Patient will come in to get a PCR and rapid test to test for COVID infection.   2) Fever, unspecified fever cause  -Will go ahead and treat patient with z-pak and test for Covid. Instructed patient to continue to check temp. And stay well hydrated with water. If the patient's fever does not resolve, patient was instructed to give [place] a call or go to the nearest emergency room.    3) Cough  Promethazine-DM syrup. Take 2.5 ml QID as needed for cough sent to pharmacy.   4) Wheezing  -Albuterol inhaler sent to pharmacy. Use as needed for shortness of breath or wheezing.   Advised patient to take Vitamin C, D, Zinc. Keep yourself hydrated  with a lot of water and rest. Take Delsym for cough and Mucinex. Take Tylenol or pain reliever every 4-6 hours as needed for pain/fever/body ache. Educated patient if symptoms get worse or if she experiences any SOB or pain in her legs to seek immediate emergency care. Continue to monitor your pulse oxygen.  Wear a mask around other people.   Follow Up: if symptoms do not improve or get worse.   COVID-19 Education: The signs and symptoms of COVID-19 were discussed with the patient and how to seek care for testing (follow up with PCP or arrange E-visit).  The importance of social distancing was discussed today.  Patient  Risk:   After full review of this patients clinical status, I feel that they are at least moderate risk at this time.  Time:   Today, I have spent 20 minutes/ seconds with the patient with telehealth technology discussing above diagnoses.     Medication Adjustments/Labs and Tests Ordered: Current medicines are reviewed at length with the patient today.  Concerns regarding medicines are outlined above.   Tests Ordered: No orders of the defined types were placed in this encounter.   Medication Changes: No orders of the defined types were placed in this encounter.   Disposition:  Follow up if symptoms get worse and do not subside.   Signed, Charlesetta Ivory, NP

## 2020-08-05 ENCOUNTER — Telehealth: Payer: Self-pay

## 2020-08-05 LAB — SARS-COV-2, NAA 2 DAY TAT

## 2020-08-05 LAB — NOVEL CORONAVIRUS, NAA: SARS-CoV-2, NAA: DETECTED — AB

## 2020-08-05 NOTE — Telephone Encounter (Signed)
Called to discuss with patient about COVID-19 symptoms and the use of one of the available treatments for those with mild to moderate Covid symptoms and at a high risk of hospitalization.  Pt appears to qualify for outpatient treatment due to co-morbid conditions and/or a member of an at-risk group in accordance with the FDA Emergency Use Authorization.    Symptom onset: 08/01/20 fever,cough Vaccinated: Yes Booster? Yes Immunocompromised? Yes Qualifiers: HTN NIH Criteria:   Declines any further treatment.   Erik West

## 2020-08-09 ENCOUNTER — Other Ambulatory Visit: Payer: Self-pay | Admitting: Nurse Practitioner

## 2020-08-09 ENCOUNTER — Encounter: Payer: Self-pay | Admitting: Nurse Practitioner

## 2020-08-09 DIAGNOSIS — R059 Cough, unspecified: Secondary | ICD-10-CM

## 2020-08-09 DIAGNOSIS — U071 COVID-19: Secondary | ICD-10-CM

## 2020-08-09 MED ORDER — BENZONATATE 100 MG PO CAPS: 100.0000 mg | ORAL_CAPSULE | Freq: Four times a day (QID) | ORAL | 1 refills | Status: DC | PRN

## 2020-08-09 NOTE — Telephone Encounter (Signed)
Can you write him a letter to return to work on May 9th  Thanks

## 2020-08-09 NOTE — Telephone Encounter (Signed)
Yes it printed.

## 2020-08-11 ENCOUNTER — Other Ambulatory Visit: Payer: Self-pay | Admitting: Nurse Practitioner

## 2020-08-11 DIAGNOSIS — I10 Essential (primary) hypertension: Secondary | ICD-10-CM

## 2020-08-25 ENCOUNTER — Other Ambulatory Visit: Payer: Self-pay | Admitting: Nurse Practitioner

## 2020-08-25 DIAGNOSIS — I10 Essential (primary) hypertension: Secondary | ICD-10-CM

## 2020-09-01 DIAGNOSIS — G4733 Obstructive sleep apnea (adult) (pediatric): Secondary | ICD-10-CM | POA: Diagnosis not present

## 2020-09-01 DIAGNOSIS — J351 Hypertrophy of tonsils: Secondary | ICD-10-CM | POA: Diagnosis not present

## 2020-09-03 ENCOUNTER — Other Ambulatory Visit: Payer: Self-pay | Admitting: Otolaryngology

## 2020-09-09 ENCOUNTER — Other Ambulatory Visit: Payer: Self-pay | Admitting: Nurse Practitioner

## 2020-09-09 DIAGNOSIS — I10 Essential (primary) hypertension: Secondary | ICD-10-CM

## 2020-09-13 DIAGNOSIS — Z7251 High risk heterosexual behavior: Secondary | ICD-10-CM | POA: Diagnosis not present

## 2020-10-07 ENCOUNTER — Encounter (HOSPITAL_COMMUNITY): Payer: Self-pay | Admitting: Otolaryngology

## 2020-10-07 ENCOUNTER — Other Ambulatory Visit: Payer: Self-pay | Admitting: Otolaryngology

## 2020-10-07 ENCOUNTER — Other Ambulatory Visit: Payer: Self-pay

## 2020-10-07 NOTE — Progress Notes (Signed)
Vinnie Level made aware of indication error in surgical consent in order.

## 2020-10-07 NOTE — Progress Notes (Addendum)
PCP - [name], FNP Cardiologist -  EKG - DOS Chest x-ray -  ECHO -  Cardiac Cath -  CPAP - BPAP   COVID TEST- positive 08/04/20 - pt reports no symptoms as of current, just returned to work.   Anesthesia review: n/a  -------------  SDW INSTRUCTIONS:  Your procedure is scheduled on 7/1 Friday . Please report to [facility] Main Entrance "A" at 0630 A.M., and check in at the Admitting office. Call this number if you have problems the morning of surgery: [phone]   Remember: Do not eat or drink after midnight the night before your surgery   Medications to take morning of surgery with a sip of water include: Inhaler - Please bring all inhalers with you the day of surgery.  Norvasc Zyrtec  As of today, STOP taking any Aspirin (unless otherwise instructed by your surgeon), Aleve, Naproxen, Ibuprofen, Motrin, Advil, Goody's, BC's, all herbal medications, fish oil, and all vitamins.    The Morning of Surgery Do not wear jewelry Do not wear lotions, powders, colognes, or deodorant Men may shave face and neck. Do not bring valuables to the hospital. [facility] is not responsible for any belongings or valuables.  If you are a smoker, DO NOT Smoke 24 hours prior to surgery If you wear a CPAP at night please bring your mask the morning of surgery  Remember that you must have someone to transport you home after your surgery, and remain with you for 24 hours if you are discharged the same day.  Please bring cases for contacts, glasses, hearing aids, dentures or bridgework because it cannot be worn into surgery.   Patients discharged the day of surgery will not be allowed to drive home.   Please shower the NIGHT BEFORE/MORNING OF SURGERY (use antibacterial soap like DIAL soap if possible). Wear comfortable clothes the morning of surgery. Oral Hygiene is also important to reduce your risk of infection.  Remember - BRUSH YOUR TEETH THE MORNING OF SURGERY WITH YOUR REGULAR  TOOTHPASTE  Patient denies shortness of breath, fever, cough and chest pain.

## 2020-10-08 ENCOUNTER — Ambulatory Visit (HOSPITAL_COMMUNITY): Admission: RE | Admit: 2020-10-08 | Payer: BC Managed Care – PPO | Source: Home / Self Care | Admitting: Otolaryngology

## 2020-10-08 SURGERY — TONSILLECTOMY
Anesthesia: General

## 2020-10-20 ENCOUNTER — Ambulatory Visit: Payer: BC Managed Care – PPO | Admitting: Nurse Practitioner

## 2020-10-20 ENCOUNTER — Encounter: Payer: Self-pay | Admitting: Nurse Practitioner

## 2020-10-20 ENCOUNTER — Other Ambulatory Visit: Payer: Self-pay

## 2020-10-20 VITALS — BP 124/70 | HR 72 | Temp 99.0°F | Ht 70.0 in | Wt 223.0 lb

## 2020-10-20 DIAGNOSIS — I10 Essential (primary) hypertension: Secondary | ICD-10-CM

## 2020-10-20 NOTE — Progress Notes (Signed)
I,Yamilka Roman [employer] as a Neurosurgeon for [employer], FNP.,have documented all relevant documentation on the behalf of [name], FNP,as directed by  [name], FNP while in the presence of [name], FNP.  This visit occurred during the SARS-CoV-2 public health emergency.  Safety protocols were in place, including screening questions prior to the visit, additional usage of staff PPE, and extensive cleaning of exam room while observing appropriate contact time as indicated for disinfecting solutions.  Subjective:     Patient ID: Erik West , male    DOB: 1981-02-18 , 40 y.o.   MRN: [id number]   Chief Complaint  Patient presents with   Hypertension    HPI  Patient presents today for a blood pressure f/u. He is to have surgery on his tonsils in August due to his wife having surgery.   Wt Readings from Last 3 Encounters: 10/20/20 : 223 lb (101.2 kg) 08/04/20 : 215 lb (97.5 kg) 07/15/20 : 215 lb 9.6 oz (97.8 kg)     Hypertension This is a chronic problem. The current episode started more than 1 year ago. The problem has been gradually improving since onset. The problem is uncontrolled. Pertinent negatives include no anxiety, chest pain, headaches, palpitations or shortness of breath. There are no associated agents to hypertension. Risk factors for coronary artery disease include male gender. Past treatments include calcium channel blockers, angiotensin blockers and diuretics. There are no compliance problems.  There is no history of angina. There is no history of chronic renal disease.    Past Medical History:  Diagnosis Date   Essential hypertension    Hypersomnia    Sleep apnea    diagnosed "maybe a year ago" uses CPAP     Family History  Problem Relation Age of Onset   Hypertension Paternal Grandmother    Cancer Other      Current Outpatient Medications:    albuterol (VENTOLIN HFA) 108 (90 Base) MCG/ACT inhaler, Inhale 2 puffs into the lungs every 6  (six) hours as needed for wheezing or shortness of breath., Disp: 8 g, Rfl: 2   amLODipine (NORVASC) 2.5 MG tablet, TAKE 1 TABLET BY MOUTH EVERY DAY, Disp: 90 tablet, Rfl: 1   cetirizine (ZYRTEC) 10 MG tablet, TAKE 1 TABLET BY MOUTH EVERY DAY, Disp: 90 tablet, Rfl: 0   CREATINE PO, Take by mouth., Disp: , Rfl:    D-5000 125 MCG (5000 UT) TABS, TAKE 1 TABLET BY MOUTH EVERY DAY, Disp: 90 tablet, Rfl: 3   hydrochlorothiazide (HYDRODIURIL) 12.5 MG tablet, TAKE 1 TABLET BY MOUTH EVERY DAY, Disp: 90 tablet, Rfl: 1   lisinopril (ZESTRIL) 10 MG tablet, TAKE 1 TABLET BY MOUTH EVERY DAY, Disp: 90 tablet, Rfl: 1   Magnesium 200 MG TABS, Take 1 tablet by mouth daily with evening meal, Disp: 30 tablet, Rfl: 2   Omega-3 Fatty Acids (FISH OIL) 1000 MG CAPS, Take by mouth., Disp: , Rfl:    vitamin C (ASCORBIC ACID) 500 MG tablet, Take by mouth daily., Disp: , Rfl:    hydrochlorothiazide (HYDRODIURIL) 25 MG tablet, Take 1 tablet (25 mg total) by mouth daily. (Patient not taking: Reported on 10/20/2020), Disp: 90 tablet, Rfl: 1   No Known Allergies   Review of Systems  Constitutional: Negative.   Eyes: Negative.   Respiratory:  Negative for shortness of breath and wheezing.   Cardiovascular:  Negative for chest pain and palpitations.  Gastrointestinal: Negative.   Musculoskeletal: Negative.   Neurological:  Negative for headaches.  Psychiatric/Behavioral: Negative.      Today's Vitals   10/20/20 1632  BP: 124/70  Pulse: 72  Temp: 99 F (37.2 C)  Weight: 223 lb (101.2 kg)  Height: 5\' 10"  (1.778 m)  PainSc: 0-No pain   Body mass index is 32 kg/m.   Objective:  Physical Exam Vitals reviewed.  Constitutional:      General: He is not in acute distress.    Appearance: Normal appearance.  Cardiovascular:     Rate and Rhythm: Normal rate and regular rhythm.     Pulses: Normal pulses.     Heart sounds: Normal heart sounds. No murmur heard. Pulmonary:     Effort: Pulmonary effort is normal. No  respiratory distress.     Breath sounds: Normal breath sounds. No wheezing.  Neurological:     General: No focal deficit present.     Mental Status: He is alert and oriented to person, place, and time.     Cranial Nerves: No cranial nerve deficit.  Psychiatric:        Mood and Affect: Mood normal.        Behavior: Behavior normal.        Thought Content: Thought content normal.        Judgment: Judgment normal.        Assessment And Plan:     1. Essential hypertension Comments: Blood pressure is well controlled. He is to have shoulder surgery but would like to wait until closer to his surgery date     Patient was given opportunity to ask questions. Patient verbalized understanding of the plan and was able to repeat key elements of the plan. All questions were answered to their satisfaction.  , FNP   I, [name], FNP, have reviewed all documentation for this visit. The documentation on 10/20/20 for the exam, diagnosis, procedures, and orders are all accurate and complete.   IF YOU HAVE BEEN REFERRED TO A SPECIALIST, IT MAY TAKE 1-2 WEEKS TO SCHEDULE/PROCESS THE REFERRAL. IF YOU HAVE NOT HEARD FROM US/SPECIALIST IN TWO WEEKS, PLEASE GIVE 10/22/20 A CALL AT [phone].   THE PATIENT IS ENCOURAGED TO PRACTICE SOCIAL DISTANCING DUE TO THE COVID-19 PANDEMIC.

## 2020-10-20 NOTE — Patient Instructions (Signed)

## 2020-12-20 ENCOUNTER — Other Ambulatory Visit: Payer: Self-pay

## 2020-12-20 ENCOUNTER — Encounter: Payer: Self-pay | Admitting: Nurse Practitioner

## 2020-12-20 ENCOUNTER — Ambulatory Visit (INDEPENDENT_AMBULATORY_CARE_PROVIDER_SITE_OTHER): Payer: BC Managed Care – PPO | Admitting: Nurse Practitioner

## 2020-12-20 VITALS — BP 110/82 | HR 85 | Temp 97.6°F | Ht 70.0 in | Wt 218.4 lb

## 2020-12-20 DIAGNOSIS — M25522 Pain in left elbow: Secondary | ICD-10-CM | POA: Diagnosis not present

## 2020-12-20 DIAGNOSIS — Z0000 Encounter for general adult medical examination without abnormal findings: Secondary | ICD-10-CM

## 2020-12-20 DIAGNOSIS — E6609 Other obesity due to excess calories: Secondary | ICD-10-CM | POA: Diagnosis not present

## 2020-12-20 DIAGNOSIS — Z79899 Other long term (current) drug therapy: Secondary | ICD-10-CM | POA: Diagnosis not present

## 2020-12-20 DIAGNOSIS — E7800 Pure hypercholesterolemia, unspecified: Secondary | ICD-10-CM | POA: Diagnosis not present

## 2020-12-20 DIAGNOSIS — Z2821 Immunization not carried out because of patient refusal: Secondary | ICD-10-CM

## 2020-12-20 DIAGNOSIS — I10 Essential (primary) hypertension: Secondary | ICD-10-CM | POA: Diagnosis not present

## 2020-12-20 DIAGNOSIS — Z6831 Body mass index (BMI) 31.0-31.9, adult: Secondary | ICD-10-CM

## 2020-12-20 DIAGNOSIS — G4733 Obstructive sleep apnea (adult) (pediatric): Secondary | ICD-10-CM

## 2020-12-20 NOTE — Patient Instructions (Signed)
Health Maintenance, Male Adopting a healthy lifestyle and getting preventive care are important in promoting health and wellness. Ask your health care provider about: The right schedule for you to have regular tests and exams. Things you can do on your own to prevent diseases and keep yourself healthy. What should I know about diet, weight, and exercise? Eat a healthy diet  Eat a diet that includes plenty of vegetables, fruits, low-fat dairy products, and lean protein. Do not eat a lot of foods that are high in solid fats, added sugars, or sodium. Maintain a healthy weight Body mass index (BMI) is a measurement that can be used to identify possible weight problems. It estimates body fat based on height and weight. Your health care provider can help determine your BMI and help you achieve or maintain a healthy weight. Get regular exercise Get regular exercise. This is one of the most important things you can do for your health. Most adults should: Exercise for at least 150 minutes each week. The exercise should increase your heart rate and make you sweat (moderate-intensity exercise). Do strengthening exercises at least twice a week. This is in addition to the moderate-intensity exercise. Spend less time sitting. Even light physical activity can be beneficial. Watch cholesterol and blood lipids Have your blood tested for lipids and cholesterol at 40 years of age, then have this test every 5 years. You may need to have your cholesterol levels checked more often if: Your lipid or cholesterol levels are high. You are older than 40 years of age. You are at high risk for heart disease. What should I know about cancer screening? Many types of cancers can be detected early and may often be prevented. Depending on your health history and family history, you may need to have cancer screening at various ages. This may include screening for: Colorectal cancer. Prostate cancer. Skin cancer. Lung  cancer. What should I know about heart disease, diabetes, and high blood pressure? Blood pressure and heart disease High blood pressure causes heart disease and increases the risk of stroke. This is more likely to develop in people who have high blood pressure readings, are of African descent, or are overweight. Talk with your health care provider about your target blood pressure readings. Have your blood pressure checked: Every 3-5 years if you are 18-39 years of age. Every year if you are 40 years old or older. If you are between the ages of 65 and 75 and are a current or former smoker, ask your health care provider if you should have a one-time screening for abdominal aortic aneurysm (AAA). Diabetes Have regular diabetes screenings. This checks your fasting blood sugar level. Have the screening done: Once every three years after age 45 if you are at a normal weight and have a low risk for diabetes. More often and at a younger age if you are overweight or have a high risk for diabetes. What should I know about preventing infection? Hepatitis B If you have a higher risk for hepatitis B, you should be screened for this virus. Talk with your health care provider to find out if you are at risk for hepatitis B infection. Hepatitis C Blood testing is recommended for: Everyone born from 1945 through 1965. Anyone with known risk factors for hepatitis C. Sexually transmitted infections (STIs) You should be screened each year for STIs, including gonorrhea and chlamydia, if: You are sexually active and are younger than 40 years of age. You are older than 40 years   of age and your health care provider tells you that you are at risk for this type of infection. Your sexual activity has changed since you were last screened, and you are at increased risk for chlamydia or gonorrhea. Ask your health care provider if you are at risk. Ask your health care provider about whether you are at high risk for HIV.  Your health care provider may recommend a prescription medicine to help prevent HIV infection. If you choose to take medicine to prevent HIV, you should first get tested for HIV. You should then be tested every 3 months for as long as you are taking the medicine. Follow these instructions at home: Lifestyle Do not use any products that contain nicotine or tobacco, such as cigarettes, e-cigarettes, and chewing tobacco. If you need help quitting, ask your health care provider. Do not use street drugs. Do not share needles. Ask your health care provider for help if you need support or information about quitting drugs. Alcohol use Do not drink alcohol if your health care provider tells you not to drink. If you drink alcohol: Limit how much you have to 0-2 drinks a day. Be aware of how much alcohol is in your drink. In the [place] one drink equals one 12 oz bottle of beer (355 mL), one 5 oz glass of wine (148 mL), or one 1 oz glass of hard liquor (44 mL). General instructions Schedule regular health, dental, and eye exams. Stay current with your vaccines. Tell your health care provider if: You often feel depressed. You have ever been abused or do not feel safe at home. Summary Adopting a healthy lifestyle and getting preventive care are important in promoting health and wellness. Follow your health care provider's instructions about healthy diet, exercising, and getting tested or screened for diseases. Follow your health care provider's instructions on monitoring your cholesterol and blood pressure. This information is not intended to replace advice given to you by your health care provider. Make sure you discuss any questions you have with your health care provider. Document Revised: 06/04/2020 Document Reviewed: 03/20/2018 Elsevier Patient Education  2022 [employer]  

## 2020-12-20 NOTE — Progress Notes (Signed)
I,[name],acting as a Education administrator for [employer], FNP.,have documented all relevant documentation on the behalf of [name], FNP,as directed by  [name], FNP while in the presence of [name], [place].  This visit occurred during the SARS-CoV-2 public health emergency.  Safety protocols were in place, including screening questions prior to the visit, additional usage of staff PPE, and extensive cleaning of exam room while observing appropriate contact time as indicated for disinfecting solutions.  Subjective:     Patient ID: Erik West , male    DOB: 1981/02/25 , 40 y.o.   MRN: [id number]   Chief Complaint  Patient presents with   Annual Exam    HPI  Pt is here for Health maintenance exam  Wt Readings from Last 3 Encounters: 12/20/20 : 218 lb 6.4 oz (99.1 kg) 10/20/20 : 223 lb (101.2 kg) 08/04/20 : 215 lb (97.5 kg)  He reports his weight had dropped to 210 lbs then gained 5 lbs back.     Hypertension This is a chronic problem. The current episode started more than 1 year ago. The problem is unchanged. The problem is uncontrolled. Pertinent negatives include no anxiety, blurred vision, chest pain, headaches, palpitations or shortness of breath. Risk factors for coronary artery disease include sedentary lifestyle. Past treatments include ACE inhibitors. There are no compliance problems.  There is no history of angina. There is no history of chronic renal disease.    Past Medical History:  Diagnosis Date   Essential hypertension    Hypersomnia    Sleep apnea    diagnosed "maybe a year ago" uses CPAP     Family History  Problem Relation Age of Onset   Hypertension Paternal Grandmother    Cancer Other      Current Outpatient Medications:    albuterol (VENTOLIN HFA) 108 (90 Base) MCG/ACT inhaler, Inhale 2 puffs into the lungs every 6 (six) hours as needed for wheezing or shortness of breath., Disp: 8 g, Rfl: 2   amLODipine (NORVASC) 2.5 MG tablet, TAKE 1  TABLET BY MOUTH EVERY DAY, Disp: 90 tablet, Rfl: 1   cetirizine (ZYRTEC) 10 MG tablet, TAKE 1 TABLET BY MOUTH EVERY DAY, Disp: 90 tablet, Rfl: 0   D-5000 125 MCG (5000 UT) TABS, TAKE 1 TABLET BY MOUTH EVERY DAY, Disp: 90 tablet, Rfl: 3   hydrochlorothiazide (HYDRODIURIL) 12.5 MG tablet, TAKE 1 TABLET BY MOUTH EVERY DAY, Disp: 90 tablet, Rfl: 1   lisinopril (ZESTRIL) 10 MG tablet, TAKE 1 TABLET BY MOUTH EVERY DAY, Disp: 90 tablet, Rfl: 1   Omega-3 Fatty Acids (FISH OIL) 1000 MG CAPS, Take by mouth., Disp: , Rfl:    vitamin C (ASCORBIC ACID) 500 MG tablet, Take by mouth daily., Disp: , Rfl:    No Known Allergies   Men's preventive visit. Patient Health Questionnaire (PHQ-2) is  [place] Office Visit from 12/20/2020 in [facility]  PHQ-2 Total Score 0      Patient is on a regular diet; MWF he is fasting with water and the other days eat regular meals. He is also doing fast with just water for 4 days straight. Exercising 5-6 days a week, fatburner app. Marital status: Married. Relevant history for alcohol use is:  Social History   Substance and Sexual Activity  Alcohol Use Yes   Comment: occ   Relevant history for tobacco use is:  Social History   Tobacco Use  Smoking Status Never  Smokeless Tobacco Never  .  Review of Systems  Constitutional: Negative.   HENT: Negative.    Eyes: Negative.  Negative for blurred vision.  Respiratory: Negative.  Negative for shortness of breath.   Cardiovascular: Negative.  Negative for chest pain and palpitations.  Gastrointestinal: Negative.   Endocrine: Negative.   Genitourinary: Negative.   Musculoskeletal:  Positive for arthralgias.       Elbow   Skin: Negative.   Allergic/Immunologic: Negative.   Neurological: Negative.  Negative for headaches.  Hematological: Negative.   Psychiatric/Behavioral: Negative.      Today's Vitals   12/20/20 1503  BP: 110/82  Pulse: 85  Temp: 97.6 F (36.4 C)  TempSrc: Oral   Weight: 218 lb 6.4 oz (99.1 kg)  Height: 5' 10"  (1.778 m)   Body mass index is 31.34 kg/m.  Wt Readings from Last 3 Encounters:  12/20/20 218 lb 6.4 oz (99.1 kg)  10/20/20 223 lb (101.2 kg)  08/04/20 215 lb (97.5 kg)    Objective:  Physical Exam Vitals reviewed.  Constitutional:      General: He is not in acute distress.    Appearance: Normal appearance. He is obese.  HENT:     Head: Normocephalic and atraumatic.     Right Ear: Tympanic membrane, ear canal and external ear normal. There is no impacted cerumen.     Left Ear: Tympanic membrane, ear canal and external ear normal. There is no impacted cerumen.     Nose:     Comments: Deferred - masked    Mouth/Throat:     Comments: Deferred - masked Eyes:     Extraocular Movements: Extraocular movements intact.     Conjunctiva/sclera: Conjunctivae normal.     Pupils: Pupils are equal, round, and reactive to light.  Cardiovascular:     Rate and Rhythm: Normal rate and regular rhythm.     Pulses: Normal pulses.     Heart sounds: Normal heart sounds. No murmur heard. Pulmonary:     Effort: Pulmonary effort is normal. No respiratory distress.     Breath sounds: Normal breath sounds. No wheezing.  Abdominal:     General: Abdomen is flat. Bowel sounds are normal. There is no distension.     Palpations: Abdomen is soft.  Genitourinary:    Prostate: Normal.     Rectum: Guaiac result negative.  Musculoskeletal:        General: Normal range of motion.     Cervical back: Normal range of motion and neck supple.  Skin:    General: Skin is warm.     Capillary Refill: Capillary refill takes less than 2 seconds.  Neurological:     General: No focal deficit present.     Mental Status: He is alert and oriented to person, place, and time.  Psychiatric:        Mood and Affect: Mood normal.        Behavior: Behavior normal.        Thought Content: Thought content normal.        Judgment: Judgment normal.        Assessment And  Plan:    1. Encounter for health maintenance examination Behavior modifications discussed and diet history reviewed.   Pt will continue to exercise regularly and modify diet with low GI, plant based foods and decrease intake of processed foods.  Recommend intake of daily multivitamin, Vitamin D, and calcium.  Recommend for preventive screenings, as well as recommend immunizations that include influenza, TDAP (up to date)  2. Class 1  obesity due to excess calories with serious comorbidity and body mass index (BMI) of 31.0 to 31.9 in adult Chronic Discussed healthy diet and regular exercise options  Encouraged to exercise at least 150 minutes per week with 2 days of strength training  3. Influenza vaccination declined Patient declined influenza vaccination at this time. Patient is aware that influenza vaccine prevents illness in 70% of healthy people, and reduces hospitalizations to 30-70% in elderly. This vaccine is recommended annually. Pt is willing to accept risk associated with refusing vaccination.  4. Essential hypertension Comments: Blood pressure is normal.  Continue current medications we may plan to start weaning him off his BP meds since he is controlled - POCT Urinalysis Dipstick (81002) - Microalbumin / Creatinine Urine Ratio - EKG 12-Lead - CMP14+EGFR  5. Elevated cholesterol Comments: Stable, continue with diet low in fat - Lipid panel  6. Other long term (current) drug therapy - CBC  7. OSA (obstructive sleep apnea) Comments: Continue using CPAP machine and follow up with Neurology  8. Left elbow pain Comments: Tenderness to epicondyl space, encouraged to take NSAID two times a day for 3-5 days or use pain cream - DG Elbow Complete Left; Future - VITAMIN D 25 Hydroxy (Vit-D Deficiency, Fractures) He is encouraged to initially strive for BMI less than 30 to decrease cardiac risk. He is advised to exercise no less than 150 minutes per week.      Patient was  given opportunity to ask questions. Patient verbalized understanding of the plan and was able to repeat key elements of the plan. All questions were answered to their satisfaction.   [name], FNP   I, [name], FNP, have reviewed all documentation for this visit. The documentation on 01/07/21 for the exam, diagnosis, procedures, and orders are all accurate and complete.  THE PATIENT IS ENCOURAGED TO PRACTICE SOCIAL DISTANCING DUE TO THE COVID-19 PANDEMIC.

## 2020-12-21 LAB — LIPID PANEL
Chol/HDL Ratio: 4.8 ratio (ref 0.0–5.0)
Cholesterol, Total: 213 mg/dL — ABNORMAL HIGH (ref 100–199)
HDL: 44 mg/dL (ref >39)
LDL Chol Calc (NIH): 158 mg/dL — ABNORMAL HIGH (ref 0–99)
Triglycerides: 62 mg/dL (ref 0–149)
VLDL Cholesterol Cal: 11 mg/dL (ref 5–40)

## 2020-12-21 LAB — CBC
Hematocrit: 42.5 % (ref 37.5–51.0)
Hemoglobin: 14.7 g/dL (ref 13.0–17.7)
MCH: 29.2 pg (ref 26.6–33.0)
MCHC: 34.6 g/dL (ref 31.5–35.7)
MCV: 85 fL (ref 79–97)
Platelets: 249 10*3/uL (ref 150–450)
RBC: 5.03 x10E6/uL (ref 4.14–5.80)
RDW: 13.4 % (ref 11.6–15.4)
WBC: 4.8 10*3/uL (ref 3.4–10.8)

## 2020-12-21 LAB — CMP14+EGFR
ALT: 20 IU/L (ref 0–44)
AST: 20 IU/L (ref 0–40)
Albumin/Globulin Ratio: 1.7 (ref 1.2–2.2)
Albumin: 4.5 g/dL (ref 4.0–5.0)
Alkaline Phosphatase: 79 IU/L (ref 44–121)
BUN/Creatinine Ratio: 8 — ABNORMAL LOW (ref 9–20)
BUN: 9 mg/dL (ref 6–20)
Bilirubin Total: 0.7 mg/dL (ref 0.0–1.2)
CO2: 26 mmol/L (ref 20–29)
Calcium: 9.8 mg/dL (ref 8.7–10.2)
Chloride: 101 mmol/L (ref 96–106)
Creatinine, Ser: 1.14 mg/dL (ref 0.76–1.27)
Globulin, Total: 2.6 g/dL (ref 1.5–4.5)
Glucose: 76 mg/dL (ref 65–99)
Potassium: 3.8 mmol/L (ref 3.5–5.2)
Sodium: 143 mmol/L (ref 134–144)
Total Protein: 7.1 g/dL (ref 6.0–8.5)
eGFR: 84 mL/min/{1.73_m2} (ref >59)

## 2020-12-21 LAB — POCT URINALYSIS DIPSTICK
Bilirubin, UA: NEGATIVE
Blood, UA: NEGATIVE
Glucose, UA: NEGATIVE
Ketones, UA: NEGATIVE
Leukocytes, UA: NEGATIVE
Nitrite, UA: NEGATIVE
Protein, UA: NEGATIVE
Spec Grav, UA: >=1.03 — AB (ref 1.010–1.025)
Urobilinogen, UA: 0.2 E.U./dL
pH, UA: 6.5 (ref 5.0–8.0)

## 2020-12-21 LAB — MICROALBUMIN / CREATININE URINE RATIO
Creatinine, Urine: 254.6 mg/dL
Microalb/Creat Ratio: 4 mg/g creat (ref 0–29)
Microalbumin, Urine: 10 ug/mL

## 2020-12-21 LAB — VITAMIN D 25 HYDROXY (VIT D DEFICIENCY, FRACTURES): Vit D, 25-Hydroxy: 35.8 ng/mL (ref 30.0–100.0)

## 2021-01-26 ENCOUNTER — Encounter: Payer: Self-pay | Admitting: Nurse Practitioner

## 2021-01-26 ENCOUNTER — Ambulatory Visit: Payer: BC Managed Care – PPO | Admitting: Nurse Practitioner

## 2021-01-26 ENCOUNTER — Other Ambulatory Visit: Payer: Self-pay

## 2021-01-26 VITALS — BP 128/72 | HR 78 | Temp 98.1°F | Ht 70.0 in | Wt 210.2 lb

## 2021-01-26 DIAGNOSIS — I10 Essential (primary) hypertension: Secondary | ICD-10-CM

## 2021-01-26 MED ORDER — LISINOPRIL 10 MG PO TABS: 10.0000 mg | ORAL_TABLET | Freq: Every day | ORAL | 1 refills | Status: DC

## 2021-01-26 NOTE — Patient Instructions (Signed)

## 2021-01-26 NOTE — Progress Notes (Signed)
I,Victoria Hamilton,acting as a Neurosurgeon for [name], FNP.,have documented all relevant documentation on the behalf of [name], FNP,as directed by  [name], FNP while in the presence of [name], FNP.  This visit occurred during the SARS-CoV-2 public health emergency.  Safety protocols were in place, including screening questions prior to the visit, additional usage of staff PPE, and extensive cleaning of exam room while observing appropriate contact time as indicated for disinfecting solutions.  Subjective:     Patient ID: Erik West , male    DOB: 07/21/1980 , 40 y.o.   MRN: [id number]   Chief Complaint  Patient presents with   Hypertension     HPI  Pt here for BPC.  He has no specific questions or concerns at the moment.   Hypertension    Past Medical History:  Diagnosis Date   Essential hypertension    Hypersomnia    Sleep apnea    diagnosed "maybe a year ago" uses CPAP     Family History  Problem Relation Age of Onset   Hypertension Paternal Grandmother    Cancer Other      Current Outpatient Medications:    albuterol (VENTOLIN HFA) 108 (90 Base) MCG/ACT inhaler, Inhale 2 puffs into the lungs every 6 (six) hours as needed for wheezing or shortness of breath., Disp: 8 g, Rfl: 2   amLODipine (NORVASC) 2.5 MG tablet, TAKE 1 TABLET BY MOUTH EVERY DAY, Disp: 90 tablet, Rfl: 1   cetirizine (ZYRTEC) 10 MG tablet, TAKE 1 TABLET BY MOUTH EVERY DAY, Disp: 90 tablet, Rfl: 0   vitamin C (ASCORBIC ACID) 500 MG tablet, Take by mouth daily., Disp: , Rfl:    D-5000 125 MCG (5000 UT) TABS, TAKE 1 TABLET BY MOUTH EVERY DAY (Patient not taking: Reported on 01/26/2021), Disp: 90 tablet, Rfl: 3   lisinopril (ZESTRIL) 10 MG tablet, Take 1 tablet (10 mg total) by mouth daily., Disp: 90 tablet, Rfl: 1   Omega-3 Fatty Acids (FISH OIL) 1000 MG CAPS, Take by mouth. (Patient not taking: Reported on 01/26/2021), Disp: , Rfl:    No Known Allergies   Review of Systems   Constitutional: Negative.   HENT: Negative.    Gastrointestinal: Negative.   Genitourinary: Negative.   Musculoskeletal: Negative.   Skin: Negative.   Allergic/Immunologic: Negative.   Neurological: Negative.   Psychiatric/Behavioral: Negative.      Today's Vitals   01/26/21 1626  BP: 128/72  Pulse: 78  Temp: 98.1 F (36.7 C)  Weight: 210 lb 3.2 oz (95.3 kg)  Height: 5\' 10"  (1.778 m)   Body mass index is 30.16 kg/m.  Wt Readings from Last 3 Encounters:  01/26/21 210 lb 3.2 oz (95.3 kg)  12/20/20 218 lb 6.4 oz (99.1 kg)  10/20/20 223 lb (101.2 kg)    Objective:  Physical Exam Vitals reviewed.  Constitutional:      General: He is not in acute distress.    Appearance: Normal appearance. He is obese.  Cardiovascular:     Rate and Rhythm: Normal rate and regular rhythm.     Pulses: Normal pulses.     Heart sounds: Normal heart sounds. No murmur heard. Pulmonary:     Effort: Pulmonary effort is normal. No respiratory distress.     Breath sounds: Normal breath sounds. No wheezing.  Skin:    General: Skin is warm and dry.     Capillary Refill: Capillary refill takes less than 2 seconds.  Neurological:     General:  No focal deficit present.     Mental Status: He is alert and oriented to person, place, and time.     Cranial Nerves: No cranial nerve deficit.  Psychiatric:        Mood and Affect: Mood normal.        Behavior: Behavior normal.        Thought Content: Thought content normal.        Judgment: Judgment normal.        Assessment And Plan:     1. Essential hypertension Comments: Blood pressure is well controlled, will stop HCTZ. Return for nurse visit in 2 weeks and f/u with me in 3 months.  - lisinopril (ZESTRIL) 10 MG tablet; Take 1 tablet (10 mg total) by mouth daily.  Dispense: 90 tablet; Refill: 1    Patient was given opportunity to ask questions. Patient verbalized understanding of the plan and was able to repeat key elements of the plan. All  questions were answered to their satisfaction.  [name], FNP   I, [name], FNP, have reviewed all documentation for this visit. The documentation on 01/26/21 for the exam, diagnosis, procedures, and orders are all accurate and complete.   IF YOU HAVE BEEN REFERRED TO A SPECIALIST, IT MAY TAKE 1-2 WEEKS TO SCHEDULE/PROCESS THE REFERRAL. IF YOU HAVE NOT HEARD FROM US/SPECIALIST IN TWO WEEKS, PLEASE GIVE [place] A CALL AT [phone].   THE PATIENT IS ENCOURAGED TO PRACTICE SOCIAL DISTANCING DUE TO THE COVID-19 PANDEMIC.

## 2021-02-15 ENCOUNTER — Ambulatory Visit: Payer: BC Managed Care – PPO

## 2021-03-22 ENCOUNTER — Ambulatory Visit: Payer: BC Managed Care – PPO | Admitting: Nurse Practitioner

## 2021-04-18 DIAGNOSIS — M542 Cervicalgia: Secondary | ICD-10-CM | POA: Diagnosis not present

## 2021-04-25 ENCOUNTER — Ambulatory Visit: Payer: BC Managed Care – PPO | Admitting: Nurse Practitioner

## 2021-05-18 ENCOUNTER — Ambulatory Visit: Payer: BC Managed Care – PPO | Admitting: Nurse Practitioner

## 2021-07-02 DIAGNOSIS — R062 Wheezing: Secondary | ICD-10-CM | POA: Diagnosis not present

## 2021-07-02 DIAGNOSIS — Z202 Contact with and (suspected) exposure to infections with a predominantly sexual mode of transmission: Secondary | ICD-10-CM | POA: Diagnosis not present

## 2021-07-02 DIAGNOSIS — J301 Allergic rhinitis due to pollen: Secondary | ICD-10-CM | POA: Diagnosis not present

## 2021-12-26 ENCOUNTER — Encounter: Payer: Self-pay | Admitting: Nurse Practitioner

## 2021-12-26 ENCOUNTER — Ambulatory Visit (INDEPENDENT_AMBULATORY_CARE_PROVIDER_SITE_OTHER): Payer: BC Managed Care – PPO | Admitting: Nurse Practitioner

## 2021-12-26 VITALS — BP 130/70 | HR 87 | Temp 98.3°F | Ht 70.0 in | Wt 197.0 lb

## 2021-12-26 DIAGNOSIS — R238 Other skin changes: Secondary | ICD-10-CM

## 2021-12-26 DIAGNOSIS — Z125 Encounter for screening for malignant neoplasm of prostate: Secondary | ICD-10-CM

## 2021-12-26 DIAGNOSIS — J302 Other seasonal allergic rhinitis: Secondary | ICD-10-CM

## 2021-12-26 DIAGNOSIS — E7800 Pure hypercholesterolemia, unspecified: Secondary | ICD-10-CM | POA: Diagnosis not present

## 2021-12-26 DIAGNOSIS — G4733 Obstructive sleep apnea (adult) (pediatric): Secondary | ICD-10-CM

## 2021-12-26 DIAGNOSIS — Z0000 Encounter for general adult medical examination without abnormal findings: Secondary | ICD-10-CM

## 2021-12-26 DIAGNOSIS — Z114 Encounter for screening for human immunodeficiency virus [HIV]: Secondary | ICD-10-CM | POA: Diagnosis not present

## 2021-12-26 DIAGNOSIS — Z9989 Dependence on other enabling machines and devices: Secondary | ICD-10-CM

## 2021-12-26 DIAGNOSIS — I10 Essential (primary) hypertension: Secondary | ICD-10-CM | POA: Diagnosis not present

## 2021-12-26 DIAGNOSIS — Z79899 Other long term (current) drug therapy: Secondary | ICD-10-CM

## 2021-12-26 DIAGNOSIS — K644 Residual hemorrhoidal skin tags: Secondary | ICD-10-CM

## 2021-12-26 LAB — POCT URINALYSIS DIPSTICK
Bilirubin, UA: NEGATIVE
Glucose, UA: NEGATIVE
Ketones, UA: NEGATIVE
Leukocytes, UA: NEGATIVE
Nitrite, UA: NEGATIVE
Protein, UA: NEGATIVE
Spec Grav, UA: >=1.03 — AB (ref 1.010–1.025)
Urobilinogen, UA: 0.2 E.U./dL
pH, UA: 5.5 (ref 5.0–8.0)

## 2021-12-26 MED ORDER — HYDROCORTISONE 1 % EX CREA: TOPICAL_CREAM | CUTANEOUS | 1 refills | Status: DC

## 2021-12-26 NOTE — Progress Notes (Signed)
I,Erik West,acting as a Education administrator for [employer], FNP.,have documented all relevant documentation on the behalf of Erik Brine, FNP,as directed by  Erik Brine, FNP while in the presence of Erik West, Erik West.   Subjective:     Patient ID: Erik West , male    DOB: May 20, 1980 , 41 y.o.   MRN: [id number]   Chief Complaint  Patient presents with   Annual Exam    HPI  Pt is here for Health maintenance exam.   Hypertension This is a chronic problem. The current episode started more than 1 year ago. The problem is unchanged. The problem is uncontrolled. Pertinent negatives include no anxiety, blurred vision, chest pain, headaches, palpitations or shortness of breath. Risk factors for coronary artery disease include sedentary lifestyle. Past treatments include ACE inhibitors. There are no compliance problems.  There is no history of angina. There is no history of chronic renal disease.     Past Medical History:  Diagnosis Date   Essential hypertension    Hypersomnia    Sleep apnea    diagnosed "maybe a year ago" uses CPAP     Family History  Problem Relation Age of Onset   Hypertension Paternal Grandmother    Cancer Other      Current Outpatient Medications:    albuterol (VENTOLIN HFA) 108 (90 Base) MCG/ACT inhaler, Inhale 2 puffs into the lungs every 6 (six) hours as needed for wheezing or shortness of breath., Disp: 8 g, Rfl: 2   amLODipine (NORVASC) 2.5 MG tablet, TAKE 1 TABLET BY MOUTH EVERY DAY, Disp: 90 tablet, Rfl: 1   cetirizine (ZYRTEC) 10 MG tablet, TAKE 1 TABLET BY MOUTH EVERY DAY, Disp: 90 tablet, Rfl: 0   D-5000 125 MCG (5000 UT) TABS, TAKE 1 TABLET BY MOUTH EVERY DAY, Disp: 90 tablet, Rfl: 3   hydrocortisone cream 1 %, Apply to affected area 2 times daily, Disp: 30 g, Rfl: 1   lisinopril (ZESTRIL) 10 MG tablet, Take 1 tablet (10 mg total) by mouth daily., Disp: 90 tablet, Rfl: 1   Omega-3 Fatty Acids (FISH OIL) 1000 MG CAPS, Take by mouth., Disp: , Rfl:     vitamin C (ASCORBIC ACID) 500 MG tablet, Take by mouth daily., Disp: , Rfl:    No Known Allergies   Men's preventive visit. Patient Health Questionnaire (PHQ-2) is  [place] Office Visit from 12/26/2021 in [facility]  PHQ-2 Total Score 0      Patient is on a Regular diet and will go about 1 month with no meat. Drinks about 1/2 gallon of water a day. Marital status: Married. Relevant history for alcohol use is:  Social History   Substance and Sexual Activity  Alcohol Use Yes   Comment: occ   Relevant history for tobacco use is:  Social History   Tobacco Use  Smoking Status Never  Smokeless Tobacco Never  .   Review of Systems  Constitutional: Negative.   HENT: Negative.    Eyes: Negative.  Negative for blurred vision.  Respiratory: Negative.  Negative for shortness of breath.   Cardiovascular: Negative.  Negative for chest pain and palpitations.  Gastrointestinal: Negative.   Endocrine: Negative.   Genitourinary: Negative.   Musculoskeletal: Negative.   Skin:  Positive for rash.       Flaking scalp after washing hair. Had been wearing dreads for several years.  He has used selsun blue and t-gel.   Allergic/Immunologic: Negative.   Neurological: Negative.  Negative for headaches.  Hematological: Negative.   Psychiatric/Behavioral: Negative.       Today's Vitals   12/26/21 1521  BP: 130/70  Pulse: 87  Temp: 98.3 F (36.8 C)  TempSrc: Oral  Weight: 197 lb (89.4 kg)  Height: 5' 10"  (1.778 m)   Body mass index is 28.27 kg/m.  Wt Readings from Last 3 Encounters:  12/26/21 197 lb (89.4 kg)  01/26/21 210 lb 3.2 oz (95.3 kg)  12/20/20 218 lb 6.4 oz (99.1 kg)    Objective:  Physical Exam Vitals reviewed.  Constitutional:      General: He is not in acute distress.    Appearance: Normal appearance. He is obese.  HENT:     Head: Normocephalic and atraumatic.     Right Ear: Tympanic membrane, ear canal and external ear normal.  There is no impacted cerumen.     Left Ear: Tympanic membrane, ear canal and external ear normal. There is no impacted cerumen.  Eyes:     Extraocular Movements: Extraocular movements intact.     Conjunctiva/sclera: Conjunctivae normal.     Pupils: Pupils are equal, round, and reactive to light.  Cardiovascular:     Rate and Rhythm: Normal rate and regular rhythm.     Pulses: Normal pulses.     Heart sounds: Normal heart sounds. No murmur heard. Pulmonary:     Effort: Pulmonary effort is normal. No respiratory distress.     Breath sounds: Normal breath sounds. No wheezing.  Abdominal:     General: Abdomen is flat. Bowel sounds are normal. There is no distension.     Palpations: Abdomen is soft.     Tenderness: There is no abdominal tenderness. There is no guarding.  Genitourinary:    Prostate: Normal.     Rectum: Guaiac result negative.  Musculoskeletal:        General: No swelling or tenderness. Normal range of motion.     Cervical back: Normal range of motion and neck supple.  Skin:    General: Skin is warm and dry.     Capillary Refill: Capillary refill takes less than 2 seconds.  Neurological:     General: No focal deficit present.     Mental Status: He is alert and oriented to person, place, and time.     Cranial Nerves: No cranial nerve deficit.     Motor: No weakness.  Psychiatric:        Mood and Affect: Mood normal.        Behavior: Behavior normal.        Thought Content: Thought content normal.        Judgment: Judgment normal.         Assessment And Plan:    1. Encounter for health maintenance examination Behavior modifications discussed and diet history reviewed.   Pt will continue to exercise regularly and modify diet with low GI, plant based foods and decrease intake of processed foods.  Recommend intake of daily multivitamin, Vitamin D, and calcium.  Recommend for preventive screenings, as well as recommend immunizations that include influenza, TDAP, and  Shingles - Hemoglobin A1c  2. Other long term (current) drug therapy  3. Screening for HIV without presence of risk factors - HIV Antibody (routine testing w rflx)  4. Essential hypertension Comments: Blood pressure is controlled, continue current medications.  - POCT Urinalysis Dipstick (81002) - Microalbumin / Creatinine Urine Ratio - EKG 12-Lead - CMP14+EGFR  5. Elevated cholesterol Comments: diet controlled, encouraged to continue avoiding fried and fatty foods.  -  Lipid panel  6. Encounter for prostate cancer screening - PSA  7. Scalp irritation Comments: Would like referral to dermatology for scalp irritation, I do not see any at this time. Has tried teatree oil and selsun blue. Had been wearing dreads - Allergens (95) Foods IgE - Ambulatory referral to Dermatology - Ambulatory referral to Allergy - CBC with Differential/Platelet - Allergens, Zone 3  8. Seasonal allergies Requesting referral to allergist due to changes in allergies and skin and concerned about different allergies. Will check blood test as well. - Allergens, Zone 3  9. OSA (obstructive sleep apnea) Comments: Doing well with CPAP, improves quality of life.   10. CPAP (continuous positive airway pressure) dependence  11. Residual hemorrhoidal skin tags Comments: Small skin tag from hemorroid present to external canal, rx for preparation H sent to pharmacy - hydrocortisone cream 1 %; Apply to affected area 2 times daily  Dispense: 30 g; Refill: 1     Patient was given opportunity to ask questions. Patient verbalized understanding of the plan and was able to repeat key elements of the plan. All questions were answered to their satisfaction.   Erik Brine, FNP   I, Erik Brine, FNP, have reviewed all documentation for this visit. The documentation on 12/26/21 for the exam, diagnosis, procedures, and orders are all accurate and complete.   THE PATIENT IS ENCOURAGED TO PRACTICE SOCIAL DISTANCING  DUE TO THE COVID-19 PANDEMIC.

## 2021-12-26 NOTE — Patient Instructions (Signed)
Health Maintenance, Male Adopting a healthy lifestyle and getting preventive care are important in promoting health and wellness. Ask your health care provider about: The right schedule for you to have regular tests and exams. Things you can do on your own to prevent diseases and keep yourself healthy. What should I know about diet, weight, and exercise? Eat a healthy diet  Eat a diet that includes plenty of vegetables, fruits, low-fat dairy products, and lean protein. Do not eat a lot of foods that are high in solid fats, added sugars, or sodium. Maintain a healthy weight Body mass index (BMI) is a measurement that can be used to identify possible weight problems. It estimates body fat based on height and weight. Your health care provider can help determine your BMI and help you achieve or maintain a healthy weight. Get regular exercise Get regular exercise. This is one of the most important things you can do for your health. Most adults should: Exercise for at least 150 minutes each week. The exercise should increase your heart rate and make you sweat (moderate-intensity exercise). Do strengthening exercises at least twice a week. This is in addition to the moderate-intensity exercise. Spend less time sitting. Even light physical activity can be beneficial. Watch cholesterol and blood lipids Have your blood tested for lipids and cholesterol at 41 years of age, then have this test every 5 years. You may need to have your cholesterol levels checked more often if: Your lipid or cholesterol levels are high. You are older than 40 years of age. You are at high risk for heart disease. What should I know about cancer screening? Many types of cancers can be detected early and may often be prevented. Depending on your health history and family history, you may need to have cancer screening at various ages. This may include screening for: Colorectal cancer. Prostate cancer. Skin cancer. Lung  cancer. What should I know about heart disease, diabetes, and high blood pressure? Blood pressure and heart disease High blood pressure causes heart disease and increases the risk of stroke. This is more likely to develop in people who have high blood pressure readings or are overweight. Talk with your health care provider about your target blood pressure readings. Have your blood pressure checked: Every 3-5 years if you are 18-39 years of age. Every year if you are 40 years old or older. If you are between the ages of 65 and 75 and are a current or former smoker, ask your health care provider if you should have a one-time screening for abdominal aortic aneurysm (AAA). Diabetes Have regular diabetes screenings. This checks your fasting blood sugar level. Have the screening done: Once every three years after age 45 if you are at a normal weight and have a low risk for diabetes. More often and at a younger age if you are overweight or have a high risk for diabetes. What should I know about preventing infection? Hepatitis B If you have a higher risk for hepatitis B, you should be screened for this virus. Talk with your health care provider to find out if you are at risk for hepatitis B infection. Hepatitis C Blood testing is recommended for: Everyone born from 1945 through 1965. Anyone with known risk factors for hepatitis C. Sexually transmitted infections (STIs) You should be screened each year for STIs, including gonorrhea and chlamydia, if: You are sexually active and are younger than 41 years of age. You are older than 41 years of age and your   health care provider tells you that you are at risk for this type of infection. Your sexual activity has changed since you were last screened, and you are at increased risk for chlamydia or gonorrhea. Ask your health care provider if you are at risk. Ask your health care provider about whether you are at high risk for HIV. Your health care provider  may recommend a prescription medicine to help prevent HIV infection. If you choose to take medicine to prevent HIV, you should first get tested for HIV. You should then be tested every 3 months for as long as you are taking the medicine. Follow these instructions at home: Alcohol use Do not drink alcohol if your health care provider tells you not to drink. If you drink alcohol: Limit how much you have to 0-2 drinks a day. Know how much alcohol is in your drink. In the [place] one drink equals one 12 oz bottle of beer (355 mL), one 5 oz glass of wine (148 mL), or one 1 oz glass of hard liquor (44 mL). Lifestyle Do not use any products that contain nicotine or tobacco. These products include cigarettes, chewing tobacco, and vaping devices, such as e-cigarettes. If you need help quitting, ask your health care provider. Do not use street drugs. Do not share needles. Ask your health care provider for help if you need support or information about quitting drugs. General instructions Schedule regular health, dental, and eye exams. Stay current with your vaccines. Tell your health care provider if: You often feel depressed. You have ever been abused or do not feel safe at home. Summary Adopting a healthy lifestyle and getting preventive care are important in promoting health and wellness. Follow your health care provider's instructions about healthy diet, exercising, and getting tested or screened for diseases. Follow your health care provider's instructions on monitoring your cholesterol and blood pressure. This information is not intended to replace advice given to you by your health care provider. Make sure you discuss any questions you have with your health care provider. Document Revised: 08/16/2020 Document Reviewed: 08/16/2020 Elsevier Patient Education  2023 Elsevier Inc.  

## 2021-12-27 LAB — HIV ANTIBODY (ROUTINE TESTING W REFLEX): HIV Screen 4th Generation wRfx: NONREACTIVE

## 2021-12-28 LAB — ALLERGENS (95) FOODS IGE
Allergen Apple, IgE: 2.31 kU/L — AB
Allergen Banana IgE: 3.8 kU/L — AB
Allergen Barley IgE: 2.67 kU/L — AB
Allergen Black Pepper IgE: 0.74 kU/L — AB
Allergen Blueberry IgE: 0.28 kU/L — AB
Allergen Broccoli: 3.97 kU/L — AB
Allergen Cabbage IgE: 5.21 kU/L — AB
Allergen Carrot IgE: 4.54 kU/L — AB
Allergen Cauliflower IgE: 5.25 kU/L — AB
Allergen Celery IgE: 3.34 kU/L — AB
Allergen Cinnamon IgE: <0.1 kU/L
Allergen Coconut IgE: 2.01 kU/L — AB
Allergen Corn, IgE: 3.58 kU/L — AB
Allergen Cucumber IgE: 5.42 kU/L — AB
Allergen Garlic IgE: 5.86 kU/L — AB
Allergen Ginger IgE: 2.59 kU/L — AB
Allergen Gluten IgE: 0.46 kU/L — AB
Allergen Grape IgE: 1.4 kU/L — AB
Allergen Grapefruit IgE: 3.44 kU/L — AB
Allergen Green Bean IgE: 3.15 kU/L — AB
Allergen Green Pea IgE: 2.33 kU/L — AB
Allergen Lamb IgE: <0.1 kU/L
Allergen Lettuce IgE: 2.46 kU/L — AB
Allergen Lime IgE: 0.65 kU/L — AB
Allergen Melon IgE: 3.13 kU/L — AB
Allergen Oat IgE: 3.27 kU/L — AB
Allergen Onion IgE: 5.29 kU/L — AB
Allergen Pear IgE: 3.9 kU/L — AB
Allergen Potato, White IgE: 2.51 kU/L — AB
Allergen Rice IgE: 4.44 kU/L — AB
Allergen Salmon IgE: <0.1 kU/L
Allergen Strawberry IgE: 3.16 kU/L — AB
Allergen Sweet Potato IgE: 2.86 kU/L — AB
Allergen Tomato, IgE: 3.89 kU/L — AB
Allergen Turkey IgE: <0.1 kU/L
Allergen Watermelon IgE: 6.98 kU/L — AB
Allergen, Peach f95: 3.03 kU/L — AB
Basil: <0.1 kU/L
Beef IgE: 0.11 kU/L — AB
C074-IgE Gelatin: <0.1 kU/L
Chicken IgE: <0.1 kU/L
Chocolate/Cacao IgE: <0.1 kU/L
Clam IgE: 0.29 kU/L — AB
Codfish IgE: <0.1 kU/L
Coffee: <0.1 kU/L
Cranberry IgE: 0.51 kU/L — AB
Egg White IgE: <0.1 kU/L
F020-IgE Almond: 0.84 kU/L — AB
F023-IgE Crab: 0.33 kU/L — AB
F045-IgE Yeast: <0.1 kU/L
F076-IgE Alpha Lactalbumin: <0.1 kU/L
F077-IgE Beta Lactoglobulin: <0.1 kU/L
F078-IgE Casein: <0.1 kU/L
F080-IgE Lobster: <0.1 kU/L
F081-IgE Cheese, Cheddar Type: <0.1 kU/L
F089-IgE Mustard: 0.41 kU/L — AB
F096-IgE Avocado: 3.84 kU/L — AB
F202-IgE Cashew Nut: <0.1 kU/L
F214-IgE Spinach: 5.6 kU/L — AB
F222-IgE Tea: 0.11 kU/L — AB
F242-IgE Bing Cherry: 3.45 kU/L — AB
F261-IgE Asparagus: 4.42 kU/L — AB
F262-IgE Eggplant: 2.79 kU/L — AB
F265-IgE Cumin: 0.7 kU/L — AB
F278-IgE Bayleaf (Laurel): <0.1 kU/L
F279-IgE Chili Pepper: 1.59 kU/L — AB
F283-IgE Oregano: 0.44 kU/L — AB
F300-IgE Goat's Milk: <0.1 kU/L
F342-IgE Olive, Black: 0.65 kU/L — AB
F343-IgE Raspberry: 1.72 kU/L — AB
Hops: 1.82 kU/L — AB
IgE Egg (Yolk): <0.1 kU/L
Kidney Bean IgE: 1.59 kU/L — AB
Lemon: 5.7 kU/L — AB
Lima Bean IgE: 3.19 kU/L — AB
Malt: 2.31 kU/L — AB
Mushroom IgE: <0.1 kU/L
Orange: 2.65 kU/L — AB
Paprika IgE: 2.19 kU/L — AB
Peanut IgE: 4.66 kU/L — AB
Pineapple IgE: 2.99 kU/L — AB
Pork IgE: 0.18 kU/L — AB
Pumpkin IgE: 5.3 kU/L — AB
Red Beet: 3.71 kU/L — AB
Rye IgE: 2.94 kU/L — AB
Scallop IgE: 0.27 kU/L — AB
Sesame Seed IgE: 4.69 kU/L — AB
Shrimp IgE: <0.1 kU/L
Soybean IgE: 2.15 kU/L — AB
Tuna: <0.1 kU/L
Vanilla: 0.47 kU/L — AB
Walnut IgE: 1.62 kU/L — AB
Wheat IgE: 3.92 kU/L — AB
Whey: <0.1 kU/L
White Bean IgE: 3.11 kU/L — AB

## 2022-01-05 LAB — CBC WITH DIFFERENTIAL/PLATELET
Basophils Absolute: 0 10*3/uL (ref 0.0–0.2)
Basos: 1 %
EOS (ABSOLUTE): 0.1 10*3/uL (ref 0.0–0.4)
Eos: 3 %
Hematocrit: 50.1 % (ref 37.5–51.0)
Hemoglobin: 16.5 g/dL (ref 13.0–17.7)
Immature Grans (Abs): 0 10*3/uL (ref 0.0–0.1)
Immature Granulocytes: 0 %
Lymphocytes Absolute: 1.9 10*3/uL (ref 0.7–3.1)
Lymphs: 46 %
MCH: 28.9 pg (ref 26.6–33.0)
MCHC: 32.9 g/dL (ref 31.5–35.7)
MCV: 88 fL (ref 79–97)
Monocytes Absolute: 0.4 10*3/uL (ref 0.1–0.9)
Monocytes: 11 %
Neutrophils Absolute: 1.6 10*3/uL (ref 1.4–7.0)
Neutrophils: 39 %
Platelets: 258 10*3/uL (ref 150–450)
RBC: 5.7 x10E6/uL (ref 4.14–5.80)
RDW: 13.4 % (ref 11.6–15.4)
WBC: 4 10*3/uL (ref 3.4–10.8)

## 2022-01-05 LAB — MICROALBUMIN / CREATININE URINE RATIO
Creatinine, Urine: 185.4 mg/dL
Microalb/Creat Ratio: 3 mg/g creat (ref 0–29)
Microalbumin, Urine: 5.5 ug/mL

## 2022-01-05 LAB — CMP14+EGFR
ALT: 13 IU/L (ref 0–44)
AST: 19 IU/L (ref 0–40)
Albumin/Globulin Ratio: 1.7 (ref 1.2–2.2)
Albumin: 4.7 g/dL (ref 4.1–5.1)
Alkaline Phosphatase: 99 IU/L (ref 44–121)
BUN/Creatinine Ratio: 10 (ref 9–20)
BUN: 13 mg/dL (ref 6–24)
Bilirubin Total: 0.7 mg/dL (ref 0.0–1.2)
CO2: 25 mmol/L (ref 20–29)
Calcium: 10.3 mg/dL — ABNORMAL HIGH (ref 8.7–10.2)
Chloride: 100 mmol/L (ref 96–106)
Creatinine, Ser: 1.29 mg/dL — ABNORMAL HIGH (ref 0.76–1.27)
Globulin, Total: 2.8 g/dL (ref 1.5–4.5)
Glucose: 75 mg/dL (ref 70–99)
Potassium: 4.1 mmol/L (ref 3.5–5.2)
Sodium: 141 mmol/L (ref 134–144)
Total Protein: 7.5 g/dL (ref 6.0–8.5)
eGFR: 71 mL/min/{1.73_m2} (ref >59)

## 2022-01-05 LAB — ALLERGENS, ZONE 3
Alternaria Alternata IgE: 0.2 kU/L — AB
Aspergillus Fumigatus IgE: <0.1 kU/L
Bahia Grass IgE: 64.1 kU/L — AB
Bermuda Grass IgE: 31.5 kU/L — AB
Cat Dander IgE: <0.1 kU/L
Cedar, Mountain IgE: 11 kU/L — AB
Cladosporium Herbarum IgE: <0.1 kU/L
Cockroach, American IgE: 0.51 kU/L — AB
Common Silver Birch IgE: 4.79 kU/L — AB
D Farinae IgE: 0.31 kU/L — AB
D Pteronyssinus IgE: <0.1 kU/L
Dog Dander IgE: 0.38 kU/L — AB
Elm, American IgE: 14.5 kU/L — AB
Hickory, White IgE: 14.6 kU/L — AB
Johnson Grass IgE: 25.2 kU/L — AB
Kentucky Bluegrass IgE: >100 kU/L — AB
Maple/Box Elder IgE: 5.93 kU/L — AB
Mucor Racemosus IgE: 0.34 kU/L — AB
Nettle IgE: 5.48 kU/L — AB
Oak, White IgE: 8.92 kU/L — AB
Penicillium Chrysogen IgE: 0.1 kU/L — AB
Pigweed, Rough IgE: 2.33 kU/L — AB
Plantain, English IgE: 2.96 kU/L — AB
Ragweed, Short IgE: 4.54 kU/L — AB
Stemphylium Herbarum IgE: 0.12 kU/L — AB
White Mulberry IgE: 2.89 kU/L — AB

## 2022-01-05 LAB — LIPID PANEL
Chol/HDL Ratio: 4.6 ratio (ref 0.0–5.0)
Cholesterol, Total: 211 mg/dL — ABNORMAL HIGH (ref 100–199)
HDL: 46 mg/dL (ref >39)
LDL Chol Calc (NIH): 147 mg/dL — ABNORMAL HIGH (ref 0–99)
Triglycerides: 100 mg/dL (ref 0–149)
VLDL Cholesterol Cal: 18 mg/dL (ref 5–40)

## 2022-01-05 LAB — HEMOGLOBIN A1C
Est. average glucose Bld gHb Est-mCnc: 114 mg/dL
Hgb A1c MFr Bld: 5.6 % (ref 4.8–5.6)

## 2022-01-05 LAB — PSA: Prostate Specific Ag, Serum: 1.3 ng/mL (ref 0.0–4.0)

## 2022-03-21 DIAGNOSIS — M542 Cervicalgia: Secondary | ICD-10-CM | POA: Diagnosis not present

## 2022-03-21 DIAGNOSIS — M545 Low back pain, unspecified: Secondary | ICD-10-CM | POA: Diagnosis not present

## 2022-03-22 ENCOUNTER — Encounter (HOSPITAL_BASED_OUTPATIENT_CLINIC_OR_DEPARTMENT_OTHER): Payer: Self-pay

## 2022-03-22 ENCOUNTER — Emergency Department (HOSPITAL_BASED_OUTPATIENT_CLINIC_OR_DEPARTMENT_OTHER): Payer: BC Managed Care – PPO

## 2022-03-22 ENCOUNTER — Emergency Department (HOSPITAL_BASED_OUTPATIENT_CLINIC_OR_DEPARTMENT_OTHER)
Admission: EM | Admit: 2022-03-22 | Discharge: 2022-03-22 | Disposition: A | Discharge status: Home / Self Care | Payer: BC Managed Care – PPO | Attending: Emergency Medicine | Admitting: Emergency Medicine

## 2022-03-22 ENCOUNTER — Other Ambulatory Visit: Payer: Self-pay

## 2022-03-22 DIAGNOSIS — M25551 Pain in right hip: Secondary | ICD-10-CM | POA: Diagnosis not present

## 2022-03-22 DIAGNOSIS — M62838 Other muscle spasm: Secondary | ICD-10-CM | POA: Insufficient documentation

## 2022-03-22 DIAGNOSIS — I10 Essential (primary) hypertension: Secondary | ICD-10-CM | POA: Insufficient documentation

## 2022-03-22 DIAGNOSIS — T148XXA Other injury of unspecified body region, initial encounter: Secondary | ICD-10-CM

## 2022-03-22 DIAGNOSIS — S7001XA Contusion of right hip, initial encounter: Secondary | ICD-10-CM | POA: Insufficient documentation

## 2022-03-22 DIAGNOSIS — Z79899 Other long term (current) drug therapy: Secondary | ICD-10-CM | POA: Insufficient documentation

## 2022-03-22 DIAGNOSIS — X58XXXA Exposure to other specified factors, initial encounter: Secondary | ICD-10-CM | POA: Diagnosis not present

## 2022-03-22 DIAGNOSIS — R102 Pelvic and perineal pain: Secondary | ICD-10-CM | POA: Diagnosis not present

## 2022-03-22 LAB — BASIC METABOLIC PANEL
Anion gap: 5 (ref 5–15)
BUN: 14 mg/dL (ref 6–20)
CO2: 26 mmol/L (ref 22–32)
Calcium: 9 mg/dL (ref 8.9–10.3)
Chloride: 106 mmol/L (ref 98–111)
Creatinine, Ser: 1.16 mg/dL (ref 0.61–1.24)
GFR, Estimated: >60 mL/min (ref >60)
Glucose, Bld: 91 mg/dL (ref 70–99)
Potassium: 3.8 mmol/L (ref 3.5–5.1)
Sodium: 137 mmol/L (ref 135–145)

## 2022-03-22 LAB — CBC WITH DIFFERENTIAL/PLATELET
Abs Immature Granulocytes: 0.02 10*3/uL (ref 0.00–0.07)
Basophils Absolute: 0.1 10*3/uL (ref 0.0–0.1)
Basophils Relative: 1 %
Eosinophils Absolute: 0.2 10*3/uL (ref 0.0–0.5)
Eosinophils Relative: 3 %
HCT: 46 % (ref 39.0–52.0)
Hemoglobin: 15.2 g/dL (ref 13.0–17.0)
Immature Granulocytes: 0 %
Lymphocytes Relative: 33 %
Lymphs Abs: 2.3 10*3/uL (ref 0.7–4.0)
MCH: 29.2 pg (ref 26.0–34.0)
MCHC: 33 g/dL (ref 30.0–36.0)
MCV: 88.3 fL (ref 80.0–100.0)
Monocytes Absolute: 0.7 10*3/uL (ref 0.1–1.0)
Monocytes Relative: 9 %
Neutro Abs: 3.7 10*3/uL (ref 1.7–7.7)
Neutrophils Relative %: 54 %
Platelets: 212 10*3/uL (ref 150–400)
RBC: 5.21 MIL/uL (ref 4.22–5.81)
RDW: 13.9 % (ref 11.5–15.5)
WBC: 6.9 10*3/uL (ref 4.0–10.5)
nRBC: 0 % (ref 0.0–0.2)

## 2022-03-22 MED ORDER — LIDOCAINE 5 % EX PTCH: 1.0000 | MEDICATED_PATCH | CUTANEOUS | 0 refills | Status: DC

## 2022-03-22 MED ORDER — OXYCODONE-ACETAMINOPHEN 5-325 MG PO TABS
1.0000 | ORAL_TABLET | Freq: Once | ORAL | Status: AC
  Administered 2022-03-22: 1 via ORAL
  Filled 2022-03-22: qty 1

## 2022-03-22 MED ORDER — OXYCODONE-ACETAMINOPHEN 5-325 MG PO TABS: 1.0000 | ORAL_TABLET | ORAL | 0 refills | Status: DC | PRN

## 2022-03-22 NOTE — ED Triage Notes (Addendum)
Pt c/o pain to R hip with a reported bruise to the area that was first noticeable yesterday. Pt denies injury or trauma. Pt denies other bruises or bleeding.

## 2022-03-22 NOTE — ED Provider Notes (Signed)
[facility] EMERGENCY DEPARTMENT Provider Note   CSN: [id number] Arrival date & time: 03/22/22  1825     History  Chief Complaint  Patient presents with   Hip Pain         Erik West is a 41 y.o. male.  The history is provided by the patient, medical records and a significant other. No language interpreter was used.  Hip Pain This is a new problem. The current episode started yesterday. The problem occurs constantly. The problem has not changed since onset.Pertinent negatives include no chest pain, no abdominal pain, no headaches and no shortness of breath. Nothing aggravates the symptoms. Nothing relieves the symptoms. He has tried nothing for the symptoms. The treatment provided no relief.       Home Medications Prior to Admission medications   Medication Sig Start Date End Date Taking? Authorizing Provider  albuterol (VENTOLIN HFA) 108 (90 Base) MCG/ACT inhaler Inhale 2 puffs into the lungs every 6 (six) hours as needed for wheezing or shortness of breath. 08/04/20   Ghumman, Ramandeep, NP  amLODipine (NORVASC) 2.5 MG tablet TAKE 1 TABLET BY MOUTH EVERY DAY 09/10/20   [name], FNP  cetirizine (ZYRTEC) 10 MG tablet TAKE 1 TABLET BY MOUTH EVERY DAY 10/31/19   [name], FNP  D-5000 125 MCG (5000 UT) TABS TAKE 1 TABLET BY MOUTH EVERY DAY 02/15/20   Hilts, Casimiro Needle, MD  hydrocortisone cream 1 % Apply to affected area 2 times daily 12/26/21 12/26/22  [name], FNP  lisinopril (ZESTRIL) 10 MG tablet Take 1 tablet (10 mg total) by mouth daily. 01/26/21   [name], FNP  Omega-3 Fatty Acids (FISH OIL) 1000 MG CAPS Take by mouth.    [provider]  vitamin C (ASCORBIC ACID) 500 MG tablet Take by mouth daily.    [provider]      Allergies    Patient has no known allergies.    Review of Systems   Review of Systems  Constitutional:  Negative for chills and fatigue.  HENT:  Negative for congestion.   Respiratory:  Negative for  cough, chest tightness, shortness of breath and wheezing.   Cardiovascular:  Negative for chest pain, palpitations and leg swelling.  Gastrointestinal:  Negative for abdominal pain, constipation, diarrhea, nausea and vomiting.  Genitourinary:  Negative for dysuria and flank pain.  Musculoskeletal:  Negative for back pain, neck pain and neck stiffness.  Skin:  Positive for color change (bruise). Negative for rash and wound.  Neurological:  Negative for weakness, light-headedness, numbness and headaches.  Psychiatric/Behavioral:  Negative for agitation and confusion.   All other systems reviewed and are negative.   Physical Exam Updated Vital Signs BP (!) 147/104 (BP Location: Right Arm)   Pulse 87   Temp 98.4 F (36.9 C)   Resp 14   Ht 5\' 11"  (1.803 m)   Wt 97.5 kg   SpO2 98%   BMI 29.99 kg/m  Physical Exam Vitals and nursing note reviewed.  Constitutional:      General: He is not in acute distress.    Appearance: He is well-developed. He is not ill-appearing, toxic-appearing or diaphoretic.  HENT:     Head: Normocephalic and atraumatic.     Nose: Nose normal. No congestion or rhinorrhea.     Mouth/Throat:     Mouth: Mucous membranes are moist.  Eyes:     Conjunctiva/sclera: Conjunctivae normal.  Cardiovascular:     Rate and Rhythm: Normal rate and regular rhythm.  Heart sounds: No murmur heard. Pulmonary:     Effort: Pulmonary effort is normal. No respiratory distress.     Breath sounds: Normal breath sounds. No wheezing, rhonchi or rales.  Chest:     Chest wall: No tenderness.  Abdominal:     General: Abdomen is flat.     Palpations: Abdomen is soft.     Tenderness: There is no abdominal tenderness. There is no right CVA tenderness, left CVA tenderness, guarding or rebound.  Musculoskeletal:        General: Tenderness present. No swelling or signs of injury.     Cervical back: Neck supple.       Legs:     Comments: Tenderness over the lateral right proximal  femur and hip area with some slight overlying bruising.  Muscular tenderness present.  Some pain with hip movement.  No erythema or fluctuance.  Good sensation, strength, and pulses.  Abdomen and buttock nontender.  Back nontender.  No focal neurologic deficits.  No rash to suggest shingles.  Skin:    General: Skin is warm and dry.     Capillary Refill: Capillary refill takes less than 2 seconds.     Findings: No bruising or rash.  Neurological:     General: No focal deficit present.     Mental Status: He is alert.     Sensory: No sensory deficit.     Motor: No weakness.  Psychiatric:        Mood and Affect: Mood normal.     ED Results / Procedures / Treatments   Labs (all labs ordered are listed, but only abnormal results are displayed) Labs Reviewed  CBC WITH DIFFERENTIAL/PLATELET  BASIC METABOLIC PANEL    EKG None  Radiology DG Pelvis 1-2 Views  Result Date: 03/22/2022 CLINICAL DATA:  Right hip pain EXAM: PELVIS - 1-2 VIEW COMPARISON:  None Available. FINDINGS: There is no evidence of pelvic fracture or diastasis. No pelvic bone lesions are seen. IMPRESSION: Negative. Electronically Signed   By: Darliss CheneyAmy  Guttmann M.D.   On: 03/22/2022 22:19   DG Femur Min 2 Views Right  Result Date: 03/22/2022 CLINICAL DATA:  Right upper lateral thigh pain and bruising. Right hip pain. EXAM: RIGHT FEMUR 2 VIEWS COMPARISON:  None Available. FINDINGS: There is no evidence of fracture or other focal bone lesions. Hip and knee alignment are maintained. No erosions or periostitis. Soft tissues are unremarkable. IMPRESSION: Negative radiographs of the right femur. Electronically Signed   By: Narda RutherfordMelanie  Sanford M.D.   On: 03/22/2022 22:17    Procedures Procedures    Medications Ordered in ED Medications  oxyCODONE-acetaminophen (PERCOCET/ROXICET) 5-325 MG per tablet 1 tablet (1 tablet Oral Given 03/22/22 2308)    ED Course/ Medical Decision Making/ A&P                           Medical Decision  Making Amount and/or Complexity of Data Reviewed Labs: ordered. Radiology: ordered.  Risk Prescription drug management.    Erik West is a 41 y.o. male with a past medical history significant for hypertension and previous neck surgery with muscle spasms who presents with right hip bruising and pain.  According to patient, he has had muscle spasm going on in his back and is on muscle relaxants.  He reports that he felt a pop and pain in his right hip yesterday after getting up and down from his high truck.  He denies acute trauma but  is unsure if he bumped something.  He reports the pain is moderate to severe and he has pain when he tries to ambulate or move it.  He otherwise denies fevers, chills, erythema, or warmth of the joint.  He denies history of joint infections.  Denies any leg pain or leg swelling more distally and denies any numbness, tingling, or focal weakness.  Denies any testicle or groin symptoms.  Denies any back pain at this time.  Denies any other complaints.  On exam, lungs clear and chest nontender.  Abdomen nontender.  Back nontender.  Buttock nontender.  Patient had tenderness over his lateral right proximal thigh with some subtle bruising.  No fluctuance or erythema.  No crepitance.  Normal motion in the ankle and knee.  He had some pain with hip palpation and manipulation but otherwise was able to move it.  There was more pain with active movement versus passive movement.  Intact sensation, strength and pulses distally.  No evidence of acute infection.  Given the bruising with questionable trauma we agreed to get some screening labs and imaging.  He had x-ray of the hip and femur which was unremarkable with no acute bony abnormality or dislocation.  Labs showed no thrombocytopenia or other significant lab abnormality.  Clinically I do suspect he has a ligamentous, ocular, tendinous, or soft tissue injury to the right hip area.  We discussed about possible partial tear or  muscle injury or even transient subluxation.  Patient will follow-up with his orthopedics team whom he sees as well sports medicine and agrees with discharge.  Will give prescription for some pain medicine and crutches and he will use the muscle relaxant at home.  He agrees with plan of care and was discharged in good condition.                Final Clinical Impression(s) / ED Diagnoses Final diagnoses:  Right hip pain  Bruising  Muscle spasm    Rx / DC Orders ED Discharge Orders          Ordered    oxyCODONE-acetaminophen (PERCOCET/ROXICET) 5-325 MG tablet  Every 4 hours PRN        03/22/22 2306    lidocaine (LIDODERM) 5 %  Every 24 hours        03/22/22 2306           Clinical Impression: 1. Right hip pain   2. Bruising   3. Muscle spasm     Disposition: Discharge  Condition: Good  I have discussed the results, Dx and Tx plan with the pt(& family if present). He/she/they expressed understanding and agree(s) with the plan. Discharge instructions discussed at great length. Strict return precautions discussed and pt &/or family have verbalized understanding of the instructions. No further questions at time of discharge.    Discharge Medication List as of 03/22/2022 11:07 PM     START taking these medications   Details  lidocaine (LIDODERM) 5 % Place 1 patch onto the skin daily. Remove & Discard patch within 12 hours or as directed by MD, Starting Wed 03/22/2022, Normal    oxyCODONE-acetaminophen (PERCOCET/ROXICET) 5-325 MG tablet Take 1 tablet by mouth every 4 (four) hours as needed for severe pain., Starting Wed 03/22/2022, Normal        Follow Up: Estill Bamberg, MD [address] [place] [address] [phone]     Myra Rude, MD [address] [place] [address] [phone]  [facility] EMERGENCY DEPARTMENT [address] [id number] mc [address] [place]  [address] [phone]       Murl Golladay, Canary Brim, MD 03/22/22 2350

## 2022-03-22 NOTE — Discharge Instructions (Signed)
Your history, exam, workup today did not notice of any acute bony injury or any acute lab abnormality.  I suspect he may have had some trauma and did not realize it causing the bruising and pain.  As you did have muscle spasms we felt it was reasonable to have you continue your muscle relaxants you have been on as well as consider using Lidoderm patches.  Due to the severity of the pain, we gave crutches and will give a short prescription for pain medicine.  Please follow-up with your orthopedist for further management as you may need advanced imaging as an outpatient.  If any symptoms change or worsen acutely, please return to the nearest Emergency Department.

## 2022-03-27 ENCOUNTER — Other Ambulatory Visit: Payer: Self-pay

## 2022-03-27 ENCOUNTER — Telehealth: Payer: Self-pay

## 2022-03-27 DIAGNOSIS — I10 Essential (primary) hypertension: Secondary | ICD-10-CM

## 2022-03-27 MED ORDER — AMLODIPINE BESYLATE 2.5 MG PO TABS: 2.5000 mg | ORAL_TABLET | Freq: Every day | ORAL | 1 refills | Status: DC

## 2022-03-27 MED ORDER — LISINOPRIL 10 MG PO TABS: 10.0000 mg | ORAL_TABLET | Freq: Every day | ORAL | 1 refills | Status: DC

## 2022-03-27 NOTE — Telephone Encounter (Signed)
Transition Care Management Follow-up Telephone Call Date of discharge and from where: 03/22/2022 [facility]  How have you been since you were released from the hospital? Pt states he is doing better. He also requested bp meds to be sent into the pharmacy, refill sent. Declined apt at this time. Has a apt in Jan.  Any questions or concerns? No  Items Reviewed: Did the pt receive and understand the discharge instructions provided? Yes  Medications obtained and verified? Yes  Other? Yes  Any new allergies since your discharge? No  Dietary orders reviewed? Yes Do you have support at home? Yes   Home Care and Equipment/Supplies: Were home health services ordered? no If so, what is the name of the agency? N/a  Has the agency set up a time to come to the patient's home? not applicable Were any new equipment or medical supplies ordered?  No What is the name of the medical supply agency? N/a Were you able to get the supplies/equipment? no Do you have any questions related to the use of the equipment or supplies? No  Functional Questionnaire: (I = Independent and D = Dependent) ADLs: i  Bathing/Dressing- i  Meal Prep- i  Eating- i  Maintaining continence- i  Transferring/Ambulation- i  Managing Meds- i  Follow up appointments reviewed:  PCP Hospital f/u appt confirmed? No  Scheduled to see n/a on n/a @ n/a. Specialist Hospital f/u appt confirmed? No  Scheduled to see n/a on n/a @ n/a. Are transportation arrangements needed? No  If their condition worsens, is the pt aware to call PCP or go to the Emergency Dept.? Yes Was the patient provided with contact information for the PCP's office or ED? Yes Was to pt encouraged to call back with questions or concerns? Yes

## 2022-04-27 ENCOUNTER — Ambulatory Visit: Payer: BC Managed Care – PPO | Admitting: Nurse Practitioner

## 2022-08-22 ENCOUNTER — Emergency Department (HOSPITAL_COMMUNITY): Payer: Managed Care, Other (non HMO)

## 2022-08-22 ENCOUNTER — Observation Stay (HOSPITAL_COMMUNITY)
Admission: EM | Admit: 2022-08-22 | Discharge: 2022-08-24 | Disposition: A | Discharge status: Home / Self Care | Payer: Managed Care, Other (non HMO) | Attending: General Surgery | Admitting: General Surgery

## 2022-08-22 ENCOUNTER — Ambulatory Visit: Payer: Managed Care, Other (non HMO) | Admitting: Nurse Practitioner

## 2022-08-22 ENCOUNTER — Encounter: Payer: Self-pay | Admitting: Nurse Practitioner

## 2022-08-22 ENCOUNTER — Other Ambulatory Visit: Payer: Self-pay

## 2022-08-22 VITALS — BP 140/100 | HR 105 | Temp 100.3°F | Ht 71.0 in | Wt 225.0 lb

## 2022-08-22 DIAGNOSIS — K8012 Calculus of gallbladder with acute and chronic cholecystitis without obstruction: Principal | ICD-10-CM | POA: Insufficient documentation

## 2022-08-22 DIAGNOSIS — R1011 Right upper quadrant pain: Secondary | ICD-10-CM | POA: Diagnosis present

## 2022-08-22 DIAGNOSIS — R141 Gas pain: Secondary | ICD-10-CM | POA: Diagnosis not present

## 2022-08-22 DIAGNOSIS — K810 Acute cholecystitis: Principal | ICD-10-CM | POA: Diagnosis present

## 2022-08-22 DIAGNOSIS — Z79899 Other long term (current) drug therapy: Secondary | ICD-10-CM | POA: Diagnosis not present

## 2022-08-22 DIAGNOSIS — R109 Unspecified abdominal pain: Secondary | ICD-10-CM

## 2022-08-22 DIAGNOSIS — I10 Essential (primary) hypertension: Secondary | ICD-10-CM | POA: Insufficient documentation

## 2022-08-22 DIAGNOSIS — K5900 Constipation, unspecified: Secondary | ICD-10-CM

## 2022-08-22 LAB — CBC
HCT: 48.5 % (ref 39.0–52.0)
Hemoglobin: 15.4 g/dL (ref 13.0–17.0)
MCH: 28.5 pg (ref 26.0–34.0)
MCHC: 31.8 g/dL (ref 30.0–36.0)
MCV: 89.8 fL (ref 80.0–100.0)
Platelets: 246 10*3/uL (ref 150–400)
RBC: 5.4 MIL/uL (ref 4.22–5.81)
RDW: 13.9 % (ref 11.5–15.5)
WBC: 10 10*3/uL (ref 4.0–10.5)
nRBC: 0 % (ref 0.0–0.2)

## 2022-08-22 LAB — COMPREHENSIVE METABOLIC PANEL
ALT: 26 U/L (ref 0–44)
AST: 16 U/L (ref 15–41)
Albumin: 3.8 g/dL (ref 3.5–5.0)
Alkaline Phosphatase: 78 U/L (ref 38–126)
Anion gap: 8 (ref 5–15)
BUN: 12 mg/dL (ref 6–20)
CO2: 27 mmol/L (ref 22–32)
Calcium: 8.8 mg/dL — ABNORMAL LOW (ref 8.9–10.3)
Chloride: 99 mmol/L (ref 98–111)
Creatinine, Ser: 1.35 mg/dL — ABNORMAL HIGH (ref 0.61–1.24)
GFR, Estimated: >60 mL/min (ref >60)
Glucose, Bld: 98 mg/dL (ref 70–99)
Potassium: 4.2 mmol/L (ref 3.5–5.1)
Sodium: 134 mmol/L — ABNORMAL LOW (ref 135–145)
Total Bilirubin: 0.8 mg/dL (ref 0.3–1.2)
Total Protein: 7.8 g/dL (ref 6.5–8.1)

## 2022-08-22 LAB — URINALYSIS, ROUTINE W REFLEX MICROSCOPIC
Bilirubin Urine: NEGATIVE
Glucose, UA: NEGATIVE mg/dL
Hgb urine dipstick: NEGATIVE
Ketones, ur: NEGATIVE mg/dL
Leukocytes,Ua: NEGATIVE
Nitrite: NEGATIVE
Protein, ur: NEGATIVE mg/dL
Specific Gravity, Urine: >1.046 — ABNORMAL HIGH (ref 1.005–1.030)
pH: 7 (ref 5.0–8.0)

## 2022-08-22 LAB — LIPASE, BLOOD: Lipase: 24 U/L (ref 11–51)

## 2022-08-22 MED ORDER — ACETAMINOPHEN 325 MG PO TABS
650.0000 mg | ORAL_TABLET | Freq: Once | ORAL | Status: AC
  Administered 2022-08-22: 650 mg via ORAL
  Filled 2022-08-22: qty 2

## 2022-08-22 MED ORDER — MORPHINE SULFATE (PF) 4 MG/ML IV SOLN
4.0000 mg | Freq: Once | INTRAVENOUS | Status: AC
  Administered 2022-08-22: 4 mg via INTRAVENOUS
  Filled 2022-08-22: qty 1

## 2022-08-22 MED ORDER — IOHEXOL 300 MG/ML  SOLN
100.0000 mL | Freq: Once | INTRAMUSCULAR | Status: AC | PRN
  Administered 2022-08-22: 100 mL via INTRAVENOUS

## 2022-08-22 MED ORDER — HYDRALAZINE HCL 20 MG/ML IJ SOLN
10.0000 mg | Freq: Once | INTRAMUSCULAR | Status: AC
  Administered 2022-08-22: 10 mg via INTRAVENOUS
  Filled 2022-08-22: qty 1

## 2022-08-22 MED ORDER — LIDOCAINE HCL URETHRAL/MUCOSAL 2 % EX GEL: 1.0000 | Freq: Once | CUTANEOUS | Status: DC

## 2022-08-22 MED ORDER — SODIUM CHLORIDE 0.9 % IV BOLUS
1000.0000 mL | Freq: Once | INTRAVENOUS | Status: AC
  Administered 2022-08-22: 1000 mL via INTRAVENOUS

## 2022-08-22 MED ORDER — PIPERACILLIN-TAZOBACTAM 3.375 G IVPB 30 MIN
3.3750 g | Freq: Once | INTRAVENOUS | Status: AC
  Administered 2022-08-22: 3.375 g via INTRAVENOUS
  Filled 2022-08-22: qty 50

## 2022-08-22 MED ORDER — FLEET ENEMA 7-19 GM/118ML RE ENEM: 1.0000 | ENEMA | Freq: Once | RECTAL | Status: DC

## 2022-08-22 NOTE — ED Notes (Signed)
Pt  is unable to provide urine sample at this time will let us know when he can.

## 2022-08-22 NOTE — Progress Notes (Signed)
Hershal Coria Martin,acting as a Neurosurgeon for [name], FNP.,have documented all relevant documentation on the behalf of [name], FNP,as directed by  [name], FNP while in the presence of [name], FNP.    Subjective:     Patient ID: Erik West , male    DOB: Feb 18, 1981 , 42 y.o.   MRN: [id number]   Chief Complaint  Patient presents with   Abdominal Pain    HPI  Patient presents today for stomach pain, patient reports the pain started Saturday night. Patient reports he has had no throwing up or diarrhea. Patient reports feeling bubbling then when he passes gas it is better. Patient reports not having a bowel movement in about 3 days. He has had a laxative and drank a whole bottle of mag citrate and no response. He did a suppository on Sunday with small amount of bowel movement. Took alka seltzer as well. Has not been eating his greens and they are in the middle of moving so has been eating out. On Friday at at [facility] - ribs and pulled pork, potato salad and mac and cheese. He has not been taking his BP medications since Saturday.   He reports having pink as well he was diagnosed this weekend. He did a virtual visit for his eyes.    BP Readings from Last 3 Encounters: 08/22/22 : (!) 140/100 03/22/22 : (!) 148/94 12/26/21 : 130/70       Past Medical History:  Diagnosis Date   Essential hypertension    Hypersomnia    Sleep apnea    diagnosed "maybe a year ago" uses CPAP     Family History  Problem Relation Age of Onset   Hypertension Paternal Grandmother    Cancer Other      Current Outpatient Medications:    albuterol (VENTOLIN HFA) 108 (90 Base) MCG/ACT inhaler, Inhale 2 puffs into the lungs every 6 (six) hours as needed for wheezing or shortness of breath., Disp: 8 g, Rfl: 2   amLODipine (NORVASC) 2.5 MG tablet, Take 1 tablet (2.5 mg total) by mouth daily., Disp: 90 tablet, Rfl: 1   cetirizine (ZYRTEC) 10 MG tablet, TAKE 1 TABLET BY MOUTH EVERY  DAY, Disp: 90 tablet, Rfl: 0   D-5000 125 MCG (5000 UT) TABS, TAKE 1 TABLET BY MOUTH EVERY DAY, Disp: 90 tablet, Rfl: 3   hydrocortisone cream 1 %, Apply to affected area 2 times daily, Disp: 30 g, Rfl: 1   lidocaine (LIDODERM) 5 %, Place 1 patch onto the skin daily. Remove & Discard patch within 12 hours or as directed by MD, Disp: 15 patch, Rfl: 0   lisinopril (ZESTRIL) 10 MG tablet, Take 1 tablet (10 mg total) by mouth daily., Disp: 90 tablet, Rfl: 1   Omega-3 Fatty Acids (FISH OIL) 1000 MG CAPS, Take by mouth., Disp: , Rfl:    oxyCODONE-acetaminophen (PERCOCET/ROXICET) 5-325 MG tablet, Take 1 tablet by mouth every 4 (four) hours as needed for severe pain., Disp: 15 tablet, Rfl: 0   vitamin C (ASCORBIC ACID) 500 MG tablet, Take by mouth daily., Disp: , Rfl:    No Known Allergies   Review of Systems  Constitutional: Negative.   Respiratory: Negative.    Cardiovascular: Negative.   Gastrointestinal:  Positive for abdominal distention, abdominal pain and constipation. Negative for diarrhea and nausea.  Musculoskeletal: Negative.   Psychiatric/Behavioral: Negative.       Today's Vitals   08/22/22 1602  BP: (!) 140/100  Pulse: (!) 105  Temp: 100.3 F (37.9 C)  TempSrc: Oral  Weight: 225 lb (102.1 kg)  Height: 5\' 11"  (1.803 m)  PainSc: 8   PainLoc: Abdomen   Body mass index is 31.38 kg/m.  Wt Readings from Last 3 Encounters:  08/22/22 225 lb (102.1 kg)  03/22/22 215 lb (97.5 kg)  12/26/21 197 lb (89.4 kg)    The 10-year ASCVD risk score (Arnett DK, et al., 2019) is: 11.4%   Values used to calculate the score:     Age: 3 years     Sex: Male     Is Non-Hispanic African American: Yes     Diabetic: No     Tobacco smoker: Yes     Systolic Blood Pressure: 140 mmHg     Is BP treated: Yes     HDL Cholesterol: 46 mg/dL     Total Cholesterol: 211 mg/dL ++ Objective:  Physical Exam Vitals reviewed.  Constitutional:      General: He is not in acute distress.    Appearance:  He is well-developed.  Abdominal:     General: Bowel sounds are decreased.     Tenderness: There is abdominal tenderness in the right upper quadrant. There is guarding and rebound.     Hernia: No hernia is present.  Neurological:     Mental Status: He is alert.         Assessment And Plan:     1. Abdominal pain, unspecified abdominal location Comments: he has rebound tenderness to right upper quadrant, decreased bowel sounds. Abdomen is taught. I am conerned he may have appendicitis, referred to ER for evaluation. He also has a low grade fever. Does not have any nausea or vomiting. May also be constipation but he has a fever so this is concerning for a virus vs infection.   2. Gas pain    Return if symptoms worsen or fail to improve.  Patient was given opportunity to ask questions. Patient verbalized understanding of the plan and was able to repeat key elements of the plan. All questions were answered to their satisfaction.  [name], FNP   I, [name], FNP, have reviewed all documentation for this visit. The documentation on 08/22/22 for the exam, diagnosis, procedures, and orders are all accurate and complete.   IF YOU HAVE BEEN REFERRED TO A SPECIALIST, IT MAY TAKE 1-2 WEEKS TO SCHEDULE/PROCESS THE REFERRAL. IF YOU HAVE NOT HEARD FROM US/SPECIALIST IN TWO WEEKS, PLEASE GIVE [place] A CALL AT [phone] X [phone].   THE PATIENT IS ENCOURAGED TO PRACTICE SOCIAL DISTANCING DUE TO THE COVID-19 PANDEMIC.

## 2022-08-22 NOTE — Patient Instructions (Signed)
Abdominal Pain, Adult Many things can cause belly (abdominal) pain. Most times, belly pain is not dangerous. Many cases of belly pain can be watched and treated at home. Sometimes, though, belly pain is serious. Your doctor will try to find the cause of your belly pain. Follow these instructions at home:  Medicines Take over-the-counter and prescription medicines only as told by your doctor. Do not take medicines that help you poop (laxatives) unless told by your doctor. General instructions Watch your belly pain for any changes. Drink enough fluid to keep your pee (urine) pale yellow. Keep all follow-up visits as told by your doctor. This is important. Contact a doctor if: Your belly pain changes or gets worse. You are not hungry, or you lose weight without trying. You are having trouble pooping (constipated) or have watery poop (diarrhea) for more than 2-3 days. You have pain when you pee or poop. Your belly pain wakes you up at night. Your pain gets worse with meals, after eating, or with certain foods. You are vomiting and cannot keep anything down. You have a fever. You have blood in your pee. Get help right away if: Your pain does not go away as soon as your doctor says it should. You cannot stop vomiting. Your pain is only in areas of your belly, such as the right side or the left lower part of the belly. You have bloody or black poop, or poop that looks like tar. You have very bad pain, cramping, or bloating in your belly. You have signs of not having enough fluid or water in your body (dehydration), such as: Dark pee, very little pee, or no pee. Cracked lips. Dry mouth. Sunken eyes. Sleepiness. Weakness. You have trouble breathing or chest pain. Summary Many cases of belly pain can be watched and treated at home. Watch your belly pain for any changes. Take over-the-counter and prescription medicines only as told by your doctor. Contact a doctor if your belly pain  changes or gets worse. Get help right away if you have very bad pain, cramping, or bloating in your belly. This information is not intended to replace advice given to you by your health care provider. Make sure you discuss any questions you have with your health care provider. Document Revised: 08/05/2018 Document Reviewed: 08/05/2018 Elsevier Patient Education  2023 Elsevier Inc.  

## 2022-08-22 NOTE — H&P (Signed)
Erik West is an 42 y.o. male.   Chief Complaint: abdominal pain HPI: 3 day hx of RUQ abdominal pain. Thought it was constipation but suppositories  and mag citrate but  did not help. PAIN SHARP AND SEVERE, RUQ 7/10 no n/v fever or chills   Past Medical History:  Diagnosis Date   Essential hypertension    Hypersomnia    Sleep apnea    diagnosed "maybe a year ago" uses CPAP    Past Surgical History:  Procedure Laterality Date   ANTERIOR CERVICAL DECOMPRESSION/DISCECTOMY FUSION 4 LEVELS N/A 07/03/2019   Procedure: ANTERIOR CERVICAL DECOMPRESSION FUSION CERVICAL 4-5, CERVICAL 5-6, CERVICAL 6-7 WITH INSTRUMENTATION AND ALLOGRAFT;  Surgeon: Estill Bamberg, MD;  Location: [facility] OR;  Service: Orthopedics;  Laterality: N/A;    Family History  Problem Relation Age of Onset   Hypertension Paternal Grandmother    Cancer Other    Social History:  reports that he has never smoked. He has never used smokeless tobacco. He reports current alcohol use. He reports that he does not use drugs.  Allergies: No Known Allergies  (Not in a hospital admission)   Results for orders placed or performed during the hospital encounter of 08/22/22 (from the past 48 hour(s))  Urinalysis, Routine w reflex microscopic -Urine, Clean Catch     Status: Abnormal   Collection Time: 08/22/22  5:25 PM  Result Value Ref Range   Color, Urine YELLOW YELLOW   APPearance CLEAR CLEAR   Specific Gravity, Urine >1.046 (H) 1.005 - 1.030   pH 7.0 5.0 - 8.0   Glucose, UA NEGATIVE NEGATIVE mg/dL   Hgb urine dipstick NEGATIVE NEGATIVE   Bilirubin Urine NEGATIVE NEGATIVE   Ketones, ur NEGATIVE NEGATIVE mg/dL   Protein, ur NEGATIVE NEGATIVE mg/dL   Nitrite NEGATIVE NEGATIVE   Leukocytes,Ua NEGATIVE NEGATIVE    Comment: Performed at [facility], [address], [place], [place] [address]  Lipase, blood     Status: None   Collection Time: 08/22/22  5:48 PM  Result Value Ref Range   Lipase 24 11 - 51 U/L     Comment: Performed at [facility], [address], [place], [place] [address]  Comprehensive metabolic panel     Status: Abnormal   Collection Time: 08/22/22  5:48 PM  Result Value Ref Range   Sodium 134 (L) 135 - 145 mmol/L   Potassium 4.2 3.5 - 5.1 mmol/L   Chloride 99 98 - 111 mmol/L   CO2 27 22 - 32 mmol/L   Glucose, Bld 98 70 - 99 mg/dL    Comment: Glucose reference range applies only to samples taken after fasting for at least 8 hours.   BUN 12 6 - 20 mg/dL   Creatinine, Ser 0.98 (H) 0.61 - 1.24 mg/dL   Calcium 8.8 (L) 8.9 - 10.3 mg/dL   Total Protein 7.8 6.5 - 8.1 g/dL   Albumin 3.8 3.5 - 5.0 g/dL   AST 16 15 - 41 U/L   ALT 26 0 - 44 U/L   Alkaline Phosphatase 78 38 - 126 U/L   Total Bilirubin 0.8 0.3 - 1.2 mg/dL   GFR, Estimated >11 >91 mL/min    Comment: (NOTE) Calculated using the CKD-EPI Creatinine Equation (2021)    Anion gap 8 5 - 15    Comment: Performed at [facility], [address] [place], [place] [address]  CBC     Status: None   Collection Time: 08/22/22  5:48  PM  Result Value Ref Range   WBC 10.0 4.0 - 10.5 K/uL   RBC 5.40 4.22 - 5.81 MIL/uL   Hemoglobin 15.4 13.0 - 17.0 g/dL   HCT 40.9 81.1 - 91.4 %   MCV 89.8 80.0 - 100.0 fL   MCH 28.5 26.0 - 34.0 pg   MCHC 31.8 30.0 - 36.0 g/dL   RDW 78.2 95.6 - 21.3 %   Platelets 246 150 - 400 K/uL   nRBC 0.0 0.0 - 0.2 %    Comment: Performed at [facility], [address] [place], [place] [address]   US Abdomen Limited RUQ (LIVER/GB)  Result Date: 08/22/2022 CLINICAL DATA:  Right upper quadrant pain. EXAM: ULTRASOUND ABDOMEN LIMITED RIGHT UPPER QUADRANT COMPARISON:  CT 08/22/2022 FINDINGS: Gallbladder: Distended gallbladder with stones towards the neck. There is wall thickening and wall edema. Separate sludge. Common bile duct: Diameter: 7 mm.  Upper limits of normal. Liver: Tiny left hepatic lobe cyst as seen on CT. Within normal limits in  parenchymal echogenicity. Portal vein is patent on color Doppler imaging with normal direction of blood flow towards the liver. Other: None. IMPRESSION: Dilated gallbladder with sludge and stones towards the neck. There is also significant wall thickening wall edema. Findings are worrisome for acute cholecystitis. Please correlate with symptomatology. Confirmatory study with HIDA scan can be performed if there is further clinical need for imaging. Common duct at the upper limits of normal for age. Electronically Signed   By: Karen Kays M.D.   On: 08/22/2022 20:18   CT ABDOMEN PELVIS W CONTRAST  Result Date: 08/22/2022 CLINICAL DATA:  Acute abdominal pain.  Dysuria.  Constipation. EXAM: CT ABDOMEN AND PELVIS WITH CONTRAST TECHNIQUE: Multidetector CT imaging of the abdomen and pelvis was performed using the standard protocol following bolus administration of intravenous contrast. RADIATION DOSE REDUCTION: This exam was performed according to the departmental dose-optimization program which includes automated exposure control, adjustment of the mA and/or kV according to patient size and/or use of iterative reconstruction technique. CONTRAST:  OMNIPAQUE IOHEXOL 300 MG/ML  SOLN COMPARISON:  None Available. FINDINGS: Lower Chest: No acute findings. Hepatobiliary: No hepatic masses identified. A few small hepatic cysts are noted. Gallbladder is distended and shows diffuse wall thickening and mild mucosal enhancement, consistent with acute cholecystitis. No evidence of biliary ductal dilatation. Pancreas:  No mass or inflammatory changes. Spleen: Within normal limits in size and appearance. Adrenals/Urinary Tract: No suspicious masses identified. No evidence of ureteral calculi or hydronephrosis. Stomach/Bowel: No evidence of obstruction, inflammatory process or abnormal fluid collections. Normal appendix visualized. Vascular/Lymphatic: No pathologically enlarged lymph nodes. No acute vascular findings.  Reproductive:  No mass or other significant abnormality. Other:  None. Musculoskeletal:  No suspicious bone lesions identified. IMPRESSION: Findings consistent with acute cholecystitis. No evidence of biliary ductal dilatation. Electronically Signed   By: Danae Orleans M.D.   On: 08/22/2022 19:08    Review of Systems  Gastrointestinal:  Positive for abdominal pain and constipation.  All other systems reviewed and are negative.   Blood pressure (!) 146/94, pulse 90, temperature 98.4 F (36.9 C), temperature source Oral, resp. rate 17, SpO2 96 %. Physical Exam Cardiovascular:     Rate and Rhythm: Normal rate.  Pulmonary:     Effort: Pulmonary effort is normal.     Breath sounds: No stridor.  Abdominal:     Palpations: Abdomen is soft.     Tenderness: There is abdominal tenderness in the right upper quadrant.  Hernia: No hernia is present.  Skin:    General: Skin is warm.  Neurological:     General: No focal deficit present.     Mental Status: He is alert.  Psychiatric:        Mood and Affect: Mood normal.        Behavior: Behavior normal.      Assessment/Plan Acute cholecystitis- admit, IVF,ABX and lap chole when able vs perc drain ARI-IVF recheck labs  Sleep apnea - CPAP Clears until midnight - NPO after midnight    Dortha Schwalbe, MD 08/22/2022, 9:15 PM  Moderate complexity

## 2022-08-22 NOTE — ED Provider Notes (Signed)
[place] EMERGENCY DEPARTMENT AT [facility] Provider Note   CSN: [id number] Arrival date & time: 08/22/22  1706     History  Chief Complaint  Patient presents with   Constipation   Abdominal Pain    Erik West is a 42 y.o. male history of hypertension, here presenting with abdominal pain and constipation.  Patient has been constipated for the last 3 days.  Patient tried milk of Tneshia twice and also Dulcolax and had 1 loose stool 2 days ago.  However he noticed worsening abdominal distention and chills.  Patient is still eating well.  Patient states that he has abdominal cramps as well.  Went to urgent care today and was prescribed eyedrops for possible pinkeye and was diagnosed with constipation and tried MiraLAX with no relief.  The history is provided by the patient.       Home Medications Prior to Admission medications   Medication Sig Start Date End Date Taking? Authorizing Provider  albuterol (VENTOLIN HFA) 108 (90 Base) MCG/ACT inhaler Inhale 2 puffs into the lungs every 6 (six) hours as needed for wheezing or shortness of breath. 08/04/20   Ghumman, Ramandeep, NP  amLODipine (NORVASC) 2.5 MG tablet Take 1 tablet (2.5 mg total) by mouth daily. 03/27/22   [name], FNP  cetirizine (ZYRTEC) 10 MG tablet TAKE 1 TABLET BY MOUTH EVERY DAY 10/31/19   [name], FNP  D-5000 125 MCG (5000 UT) TABS TAKE 1 TABLET BY MOUTH EVERY DAY 02/15/20   Hilts, Casimiro Needle, MD  hydrocortisone cream 1 % Apply to affected area 2 times daily 12/26/21 12/26/22  [name], FNP  lidocaine (LIDODERM) 5 % Place 1 patch onto the skin daily. Remove & Discard patch within 12 hours or as directed by MD 03/22/22   Tegeler, Canary Brim, MD  lisinopril (ZESTRIL) 10 MG tablet Take 1 tablet (10 mg total) by mouth daily. 03/27/22   [name], FNP  Omega-3 Fatty Acids (FISH OIL) 1000 MG CAPS Take by mouth.    [provider]  oxyCODONE-acetaminophen (PERCOCET/ROXICET) 5-325 MG  tablet Take 1 tablet by mouth every 4 (four) hours as needed for severe pain. 03/22/22   Tegeler, Canary Brim, MD  vitamin C (ASCORBIC ACID) 500 MG tablet Take by mouth daily.    [provider]      Allergies    Patient has no known allergies.    Review of Systems   Review of Systems  Gastrointestinal:  Positive for abdominal pain and constipation.  All other systems reviewed and are negative.   Physical Exam Updated Vital Signs BP (!) 165/119 (BP Location: Right Arm)   Pulse 100   Temp 100.1 F (37.8 C) (Oral)   Resp 19   SpO2 98%  Physical Exam Vitals and nursing note reviewed.  Constitutional:      Comments: Uncomfortable  HENT:     Head: Normocephalic.  Eyes:     Extraocular Movements: Extraocular movements intact.     Pupils: Pupils are equal, round, and reactive to light.  Cardiovascular:     Rate and Rhythm: Normal rate and regular rhythm.     Heart sounds: Normal heart sounds.  Pulmonary:     Effort: Pulmonary effort is normal.     Breath sounds: Normal breath sounds.  Abdominal:     Comments: Distended and mild diffuse tenderness  Genitourinary:    Comments: Rectal- no obvious stool impaction  Skin:    General: Skin is warm.  Capillary Refill: Capillary refill takes less than 2 seconds.  Neurological:     General: No focal deficit present.     Mental Status: He is oriented to person, place, and time.  Psychiatric:        Mood and Affect: Mood normal.        Behavior: Behavior normal.     ED Results / Procedures / Treatments   Labs (all labs ordered are listed, but only abnormal results are displayed) Labs Reviewed  COMPREHENSIVE METABOLIC PANEL - Abnormal; Notable for the following components:      Result Value   Sodium 134 (*)    Creatinine, Ser 1.35 (*)    Calcium 8.8 (*)    All other components within normal limits  LIPASE, BLOOD  CBC  URINALYSIS, ROUTINE W REFLEX MICROSCOPIC    EKG None  Radiology CT ABDOMEN PELVIS W  CONTRAST  Result Date: 08/22/2022 CLINICAL DATA:  Acute abdominal pain.  Dysuria.  Constipation. EXAM: CT ABDOMEN AND PELVIS WITH CONTRAST TECHNIQUE: Multidetector CT imaging of the abdomen and pelvis was performed using the standard protocol following bolus administration of intravenous contrast. RADIATION DOSE REDUCTION: This exam was performed according to the departmental dose-optimization program which includes automated exposure control, adjustment of the mA and/or kV according to patient size and/or use of iterative reconstruction technique. CONTRAST:  OMNIPAQUE IOHEXOL 300 MG/ML  SOLN COMPARISON:  None Available. FINDINGS: Lower Chest: No acute findings. Hepatobiliary: No hepatic masses identified. A few small hepatic cysts are noted. Gallbladder is distended and shows diffuse wall thickening and mild mucosal enhancement, consistent with acute cholecystitis. No evidence of biliary ductal dilatation. Pancreas:  No mass or inflammatory changes. Spleen: Within normal limits in size and appearance. Adrenals/Urinary Tract: No suspicious masses identified. No evidence of ureteral calculi or hydronephrosis. Stomach/Bowel: No evidence of obstruction, inflammatory process or abnormal fluid collections. Normal appendix visualized. Vascular/Lymphatic: No pathologically enlarged lymph nodes. No acute vascular findings. Reproductive:  No mass or other significant abnormality. Other:  None. Musculoskeletal:  No suspicious bone lesions identified. IMPRESSION: Findings consistent with acute cholecystitis. No evidence of biliary ductal dilatation. Electronically Signed   By: Danae Orleans M.D.   On: 08/22/2022 19:08    Procedures Procedures    Medications Ordered in ED Medications  hydrALAZINE (APRESOLINE) injection 10 mg (has no administration in time range)  sodium chloride 0.9 % bolus 1,000 mL (0 mLs Intravenous Stopped 08/22/22 1851)  acetaminophen (TYLENOL) tablet 650 mg (650 mg Oral Given 08/22/22 1746)   morphine (PF) 4 MG/ML injection 4 mg (4 mg Intravenous Given 08/22/22 1746)  iohexol (OMNIPAQUE) 300 MG/ML solution 100 mL (100 mLs Intravenous Contrast Given 08/22/22 1835)    ED Course/ Medical Decision Making/ A&P                             Medical Decision Making Sun Afifi is a 42 y.o. male here presenting with abdominal distention and chills.  Patient had low-grade temperature and abdominal distention and constipation.  Did have 1 episode of diarrhea after milk of magnesia.  I am concerned for possible colitis versus diverticulitis versus ileus versus constipation.  Plan to get CBC and CMP and UA and CT abdomen pelvis.  Will hydrate and reassess.  7:16 PM CBC and CMP unremarkable.  CT showed acute cholecystitis.  Patient now has more pain in the right upper quadrant.  Consulted surgery.  7:28 PM Discussed case with Dr. Luisa Hart  from surgery.  He recommends IV Zosyn and right upper quadrant ultrasound.  He will see the patient  Problems Addressed: Acute cholecystitis: acute illness or injury Constipation, unspecified constipation type: acute illness or injury  Amount and/or Complexity of Data Reviewed Labs: ordered. Decision-making details documented in ED Course. Radiology: ordered and independent interpretation performed. Decision-making details documented in ED Course.  Risk OTC drugs. Prescription drug management. Decision regarding hospitalization.    Final Clinical Impression(s) / ED Diagnoses Final diagnoses:  None    Rx / DC Orders ED Discharge Orders     None         Charlynne Pander, MD 08/22/22 1929

## 2022-08-22 NOTE — ED Triage Notes (Signed)
Pt arrived via POV. Pt c/o constipation and abd pain for 4x days. Pt has taken mult things at home to treat and has had minimal success.  AOx4

## 2022-08-23 ENCOUNTER — Inpatient Hospital Stay (HOSPITAL_BASED_OUTPATIENT_CLINIC_OR_DEPARTMENT_OTHER): Payer: Managed Care, Other (non HMO) | Admitting: Anesthesiology

## 2022-08-23 ENCOUNTER — Encounter (HOSPITAL_COMMUNITY): Admission: EM | Disposition: A | Discharge status: Home / Self Care | Payer: Self-pay | Source: Home / Self Care

## 2022-08-23 ENCOUNTER — Other Ambulatory Visit: Payer: Self-pay

## 2022-08-23 ENCOUNTER — Encounter (HOSPITAL_COMMUNITY): Payer: Self-pay

## 2022-08-23 ENCOUNTER — Inpatient Hospital Stay (HOSPITAL_COMMUNITY): Payer: Managed Care, Other (non HMO) | Admitting: Anesthesiology

## 2022-08-23 DIAGNOSIS — K810 Acute cholecystitis: Principal | ICD-10-CM | POA: Diagnosis present

## 2022-08-23 DIAGNOSIS — E669 Obesity, unspecified: Secondary | ICD-10-CM

## 2022-08-23 DIAGNOSIS — K8000 Calculus of gallbladder with acute cholecystitis without obstruction: Secondary | ICD-10-CM | POA: Diagnosis not present

## 2022-08-23 DIAGNOSIS — I10 Essential (primary) hypertension: Secondary | ICD-10-CM | POA: Diagnosis not present

## 2022-08-23 DIAGNOSIS — G4730 Sleep apnea, unspecified: Secondary | ICD-10-CM

## 2022-08-23 DIAGNOSIS — Z6831 Body mass index (BMI) 31.0-31.9, adult: Secondary | ICD-10-CM

## 2022-08-23 HISTORY — PX: CHOLECYSTECTOMY: SHX55

## 2022-08-23 LAB — CBC
HCT: 44.4 % (ref 39.0–52.0)
Hemoglobin: 14.4 g/dL (ref 13.0–17.0)
MCH: 28.9 pg (ref 26.0–34.0)
MCHC: 32.4 g/dL (ref 30.0–36.0)
MCV: 89 fL (ref 80.0–100.0)
Platelets: 243 10*3/uL (ref 150–400)
RBC: 4.99 MIL/uL (ref 4.22–5.81)
RDW: 13.8 % (ref 11.5–15.5)
WBC: 7.6 10*3/uL (ref 4.0–10.5)
nRBC: 0 % (ref 0.0–0.2)

## 2022-08-23 LAB — COMPREHENSIVE METABOLIC PANEL
ALT: 103 U/L — ABNORMAL HIGH (ref 0–44)
AST: 81 U/L — ABNORMAL HIGH (ref 15–41)
Albumin: 3.1 g/dL — ABNORMAL LOW (ref 3.5–5.0)
Alkaline Phosphatase: 101 U/L (ref 38–126)
Anion gap: 8 (ref 5–15)
BUN: 11 mg/dL (ref 6–20)
CO2: 25 mmol/L (ref 22–32)
Calcium: 8.1 mg/dL — ABNORMAL LOW (ref 8.9–10.3)
Chloride: 101 mmol/L (ref 98–111)
Creatinine, Ser: 1.13 mg/dL (ref 0.61–1.24)
GFR, Estimated: >60 mL/min (ref >60)
Glucose, Bld: 109 mg/dL — ABNORMAL HIGH (ref 70–99)
Potassium: 3.9 mmol/L (ref 3.5–5.1)
Sodium: 134 mmol/L — ABNORMAL LOW (ref 135–145)
Total Bilirubin: 0.9 mg/dL (ref 0.3–1.2)
Total Protein: 7.2 g/dL (ref 6.5–8.1)

## 2022-08-23 SURGERY — LAPAROSCOPIC CHOLECYSTECTOMY
Anesthesia: General

## 2022-08-23 MED ORDER — DEXTROSE-NACL 5-0.9 % IV SOLN: INTRAVENOUS | Status: DC

## 2022-08-23 MED ORDER — LISINOPRIL 10 MG PO TABS
10.0000 mg | ORAL_TABLET | Freq: Every day | ORAL | Status: DC
  Administered 2022-08-23: 10 mg via ORAL
  Filled 2022-08-23: qty 1

## 2022-08-23 MED ORDER — FENTANYL CITRATE (PF) 250 MCG/5ML IJ SOLN
INTRAMUSCULAR | Status: AC
  Filled 2022-08-23: qty 5

## 2022-08-23 MED ORDER — DEXAMETHASONE SODIUM PHOSPHATE 10 MG/ML IJ SOLN
INTRAMUSCULAR | Status: DC | PRN
  Administered 2022-08-23: 8 mg via INTRAVENOUS

## 2022-08-23 MED ORDER — LACTATED RINGERS IV SOLN: INTRAVENOUS | Status: DC | PRN

## 2022-08-23 MED ORDER — ENOXAPARIN SODIUM 40 MG/0.4ML IJ SOSY
40.0000 mg | PREFILLED_SYRINGE | INTRAMUSCULAR | Status: DC
  Administered 2022-08-24: 40 mg via SUBCUTANEOUS
  Filled 2022-08-23: qty 0.4

## 2022-08-23 MED ORDER — SODIUM CHLORIDE 0.9 % IV SOLN: INTRAVENOUS | Status: DC

## 2022-08-23 MED ORDER — PROPOFOL 10 MG/ML IV BOLUS
INTRAVENOUS | Status: AC
  Filled 2022-08-23: qty 20

## 2022-08-23 MED ORDER — DIPHENHYDRAMINE HCL 50 MG/ML IJ SOLN: 25.0000 mg | Freq: Four times a day (QID) | INTRAMUSCULAR | Status: DC | PRN

## 2022-08-23 MED ORDER — HEMOSTATIC AGENTS (NO CHARGE) OPTIME
TOPICAL | Status: DC | PRN
  Administered 2022-08-23: 1 via TOPICAL

## 2022-08-23 MED ORDER — AMLODIPINE BESYLATE 5 MG PO TABS: 2.5000 mg | ORAL_TABLET | Freq: Every day | ORAL | Status: DC

## 2022-08-23 MED ORDER — PHENYLEPHRINE 80 MCG/ML (10ML) SYRINGE FOR IV PUSH (FOR BLOOD PRESSURE SUPPORT)
PREFILLED_SYRINGE | INTRAVENOUS | Status: DC | PRN
  Administered 2022-08-23: 80 ug via INTRAVENOUS

## 2022-08-23 MED ORDER — FENTANYL CITRATE (PF) 250 MCG/5ML IJ SOLN
INTRAMUSCULAR | Status: DC | PRN
  Administered 2022-08-23: 150 ug via INTRAVENOUS

## 2022-08-23 MED ORDER — PROPOFOL 10 MG/ML IV BOLUS
INTRAVENOUS | Status: DC | PRN
  Administered 2022-08-23: 200 mg via INTRAVENOUS

## 2022-08-23 MED ORDER — BUPIVACAINE HCL (PF) 0.25 % IJ SOLN
INTRAMUSCULAR | Status: DC | PRN
  Administered 2022-08-23: 30 mL

## 2022-08-23 MED ORDER — SUGAMMADEX SODIUM 200 MG/2ML IV SOLN
INTRAVENOUS | Status: DC | PRN
  Administered 2022-08-23: 200 mg via INTRAVENOUS

## 2022-08-23 MED ORDER — METHOCARBAMOL 500 MG PO TABS: 500.0000 mg | ORAL_TABLET | Freq: Three times a day (TID) | ORAL | Status: DC | PRN

## 2022-08-23 MED ORDER — ONDANSETRON 4 MG PO TBDP: 4.0000 mg | ORAL_TABLET | Freq: Four times a day (QID) | ORAL | Status: DC | PRN

## 2022-08-23 MED ORDER — MIDAZOLAM HCL 2 MG/2ML IJ SOLN
INTRAMUSCULAR | Status: AC
  Filled 2022-08-23: qty 2

## 2022-08-23 MED ORDER — INDOCYANINE GREEN 25 MG IV SOLR
2.5000 mg | Freq: Once | INTRAVENOUS | Status: AC
  Administered 2022-08-23: 2.5 mg via INTRAVENOUS

## 2022-08-23 MED ORDER — KETOROLAC TROMETHAMINE 30 MG/ML IJ SOLN: 30.0000 mg | Freq: Once | INTRAMUSCULAR | Status: DC | PRN

## 2022-08-23 MED ORDER — CHLORHEXIDINE GLUCONATE 0.12 % MT SOLN
15.0000 mL | Freq: Once | OROMUCOSAL | Status: AC
  Administered 2022-08-23: 15 mL via OROMUCOSAL

## 2022-08-23 MED ORDER — ESMOLOL HCL 100 MG/10ML IV SOLN
INTRAVENOUS | Status: AC
  Filled 2022-08-23: qty 10

## 2022-08-23 MED ORDER — PHENYLEPHRINE 80 MCG/ML (10ML) SYRINGE FOR IV PUSH (FOR BLOOD PRESSURE SUPPORT)
PREFILLED_SYRINGE | INTRAVENOUS | Status: AC
  Filled 2022-08-23: qty 10

## 2022-08-23 MED ORDER — METOPROLOL TARTRATE 5 MG/5ML IV SOLN: 5.0000 mg | Freq: Four times a day (QID) | INTRAVENOUS | Status: DC | PRN

## 2022-08-23 MED ORDER — METHOCARBAMOL 1000 MG/10ML IJ SOLN: 500.0000 mg | Freq: Three times a day (TID) | INTRAVENOUS | Status: DC | PRN

## 2022-08-23 MED ORDER — ESMOLOL HCL 100 MG/10ML IV SOLN
INTRAVENOUS | Status: DC | PRN
  Administered 2022-08-23 (×2): 30 mg via INTRAVENOUS

## 2022-08-23 MED ORDER — OXYCODONE HCL 5 MG PO TABS: 5.0000 mg | ORAL_TABLET | Freq: Once | ORAL | Status: DC | PRN

## 2022-08-23 MED ORDER — FENTANYL CITRATE PF 50 MCG/ML IJ SOSY: 25.0000 ug | PREFILLED_SYRINGE | INTRAMUSCULAR | Status: DC | PRN

## 2022-08-23 MED ORDER — AMISULPRIDE (ANTIEMETIC) 5 MG/2ML IV SOLN: 10.0000 mg | Freq: Once | INTRAVENOUS | Status: DC | PRN

## 2022-08-23 MED ORDER — ROCURONIUM BROMIDE 10 MG/ML (PF) SYRINGE
PREFILLED_SYRINGE | INTRAVENOUS | Status: DC | PRN
  Administered 2022-08-23: 20 mg via INTRAVENOUS
  Administered 2022-08-23: 60 mg via INTRAVENOUS

## 2022-08-23 MED ORDER — PROMETHAZINE HCL 25 MG/ML IJ SOLN: 6.2500 mg | INTRAMUSCULAR | Status: DC | PRN

## 2022-08-23 MED ORDER — ONDANSETRON HCL 4 MG/2ML IJ SOLN
INTRAMUSCULAR | Status: DC | PRN
  Administered 2022-08-23: 4 mg via INTRAVENOUS

## 2022-08-23 MED ORDER — ONDANSETRON HCL 4 MG/2ML IJ SOLN: 4.0000 mg | Freq: Four times a day (QID) | INTRAMUSCULAR | Status: DC | PRN

## 2022-08-23 MED ORDER — ACETAMINOPHEN 500 MG PO TABS
1000.0000 mg | ORAL_TABLET | Freq: Four times a day (QID) | ORAL | Status: DC
  Administered 2022-08-23: 1000 mg via ORAL
  Filled 2022-08-23: qty 2

## 2022-08-23 MED ORDER — ENOXAPARIN SODIUM 40 MG/0.4ML IJ SOSY: 40.0000 mg | PREFILLED_SYRINGE | INTRAMUSCULAR | Status: DC

## 2022-08-23 MED ORDER — LACTATED RINGERS IV SOLN
INTRAVENOUS | Status: AC | PRN
  Administered 2022-08-23: 2000 mL

## 2022-08-23 MED ORDER — HYDROMORPHONE HCL 1 MG/ML IJ SOLN
1.0000 mg | INTRAMUSCULAR | Status: DC | PRN
  Administered 2022-08-23 (×2): 1 mg via INTRAVENOUS
  Filled 2022-08-23 (×2): qty 1

## 2022-08-23 MED ORDER — SIMETHICONE 80 MG PO CHEW: 40.0000 mg | CHEWABLE_TABLET | Freq: Four times a day (QID) | ORAL | Status: DC | PRN

## 2022-08-23 MED ORDER — DEXMEDETOMIDINE HCL IN NACL 80 MCG/20ML IV SOLN
INTRAVENOUS | Status: DC | PRN
  Administered 2022-08-23: 8 ug via INTRAVENOUS

## 2022-08-23 MED ORDER — POLYETHYLENE GLYCOL 3350 17 G PO PACK: 17.0000 g | PACK | Freq: Every day | ORAL | Status: DC | PRN

## 2022-08-23 MED ORDER — PIPERACILLIN-TAZOBACTAM 3.375 G IVPB
3.3750 g | Freq: Three times a day (TID) | INTRAVENOUS | Status: DC
  Administered 2022-08-23 (×2): 3.375 g via INTRAVENOUS
  Filled 2022-08-23 (×2): qty 50

## 2022-08-23 MED ORDER — OXYCODONE-ACETAMINOPHEN 5-325 MG PO TABS: 1.0000 | ORAL_TABLET | ORAL | Status: DC | PRN

## 2022-08-23 MED ORDER — OXYCODONE HCL 5 MG PO TABS
5.0000 mg | ORAL_TABLET | ORAL | Status: DC | PRN
  Administered 2022-08-23 – 2022-08-24 (×2): 10 mg via ORAL
  Filled 2022-08-23 (×2): qty 2

## 2022-08-23 MED ORDER — ONE-A-DAY MENS PO TABS: 1.0000 | ORAL_TABLET | Freq: Every day | ORAL | Status: DC

## 2022-08-23 MED ORDER — ADULT MULTIVITAMIN W/MINERALS CH
1.0000 | ORAL_TABLET | Freq: Every day | ORAL | Status: DC
  Administered 2022-08-24: 1 via ORAL
  Filled 2022-08-23: qty 1

## 2022-08-23 MED ORDER — DIPHENHYDRAMINE HCL 25 MG PO CAPS: 25.0000 mg | ORAL_CAPSULE | Freq: Four times a day (QID) | ORAL | Status: DC | PRN

## 2022-08-23 MED ORDER — OXYCODONE HCL 5 MG PO TABS
5.0000 mg | ORAL_TABLET | ORAL | Status: DC | PRN
  Administered 2022-08-23: 5 mg via ORAL
  Filled 2022-08-23: qty 1

## 2022-08-23 MED ORDER — LIDOCAINE 2% (20 MG/ML) 5 ML SYRINGE
INTRAMUSCULAR | Status: DC | PRN
  Administered 2022-08-23: 60 mg via INTRAVENOUS

## 2022-08-23 MED ORDER — FENTANYL CITRATE (PF) 100 MCG/2ML IJ SOLN
INTRAMUSCULAR | Status: DC | PRN
  Administered 2022-08-23 (×2): 50 ug via INTRAVENOUS
  Administered 2022-08-23: 100 ug via INTRAVENOUS
  Administered 2022-08-23: 50 ug via INTRAVENOUS

## 2022-08-23 MED ORDER — ACETAMINOPHEN 500 MG PO TABS
1000.0000 mg | ORAL_TABLET | Freq: Four times a day (QID) | ORAL | Status: DC
  Administered 2022-08-23 – 2022-08-24 (×3): 1000 mg via ORAL
  Filled 2022-08-23 (×3): qty 2

## 2022-08-23 MED ORDER — PIPERACILLIN-TAZOBACTAM 3.375 G IVPB
3.3750 g | Freq: Three times a day (TID) | INTRAVENOUS | Status: DC
  Administered 2022-08-23 – 2022-08-24 (×2): 3.375 g via INTRAVENOUS
  Filled 2022-08-23 (×2): qty 50

## 2022-08-23 MED ORDER — ZOLPIDEM TARTRATE 5 MG PO TABS: 5.0000 mg | ORAL_TABLET | Freq: Every evening | ORAL | Status: DC | PRN

## 2022-08-23 MED ORDER — BUPIVACAINE HCL 0.25 % IJ SOLN
INTRAMUSCULAR | Status: AC
  Filled 2022-08-23: qty 1

## 2022-08-23 MED ORDER — MIDAZOLAM HCL 5 MG/5ML IJ SOLN
INTRAMUSCULAR | Status: DC | PRN
  Administered 2022-08-23: 2 mg via INTRAVENOUS

## 2022-08-23 MED ORDER — LACTATED RINGERS IV SOLN: INTRAVENOUS | Status: DC

## 2022-08-23 MED ORDER — OXYCODONE HCL 5 MG/5ML PO SOLN: 5.0000 mg | Freq: Once | ORAL | Status: DC | PRN

## 2022-08-23 MED ORDER — MORPHINE SULFATE (PF) 2 MG/ML IV SOLN: 2.0000 mg | INTRAVENOUS | Status: DC | PRN

## 2022-08-23 MED ORDER — OFLOXACIN 0.3 % OP SOLN
2.0000 [drp] | Freq: Four times a day (QID) | OPHTHALMIC | Status: DC
  Administered 2022-08-23 – 2022-08-24 (×3): 2 [drp] via OPHTHALMIC
  Filled 2022-08-23: qty 5

## 2022-08-23 MED ORDER — PIPERACILLIN-TAZOBACTAM 3.375 G IVPB: 3.3750 g | Freq: Three times a day (TID) | INTRAVENOUS | Status: DC

## 2022-08-23 SURGICAL SUPPLY — 57 items
ADH SKN CLS APL DERMABOND .7 (GAUZE/BANDAGES/DRESSINGS)
APL PRP STRL LF DISP 70% ISPRP (MISCELLANEOUS) ×1
APL SRG 38 LTWT LNG FL B (MISCELLANEOUS)
APPLICATOR ARISTA FLEXITIP XL (MISCELLANEOUS) IMPLANT
APPLIER CLIP 5 13 M/L LIGAMAX5 (MISCELLANEOUS)
APPLIER CLIP ROT 10 11.4 M/L (STAPLE)
APR CLP MED LRG 11.4X10 (STAPLE)
APR CLP MED LRG 5 ANG JAW (MISCELLANEOUS)
BAG COUNTER SPONGE SURGICOUNT (BAG) IMPLANT
BAG SPEC RTRVL 10 TROC 200 (ENDOMECHANICALS) ×1
BAG SPNG CNTER NS LX DISP (BAG)
CABLE HIGH FREQUENCY MONO STRZ (ELECTRODE) ×1 IMPLANT
CHLORAPREP W/TINT 26 (MISCELLANEOUS) ×1 IMPLANT
CLIP APPLIE 5 13 M/L LIGAMAX5 (MISCELLANEOUS) IMPLANT
CLIP APPLIE ROT 10 11.4 M/L (STAPLE) IMPLANT
CLIP LIGATING HEMO O LOK GREEN (MISCELLANEOUS) IMPLANT
COVER MAYO STAND XLG (MISCELLANEOUS) IMPLANT
COVER SURGICAL LIGHT HANDLE (MISCELLANEOUS) ×1 IMPLANT
DERMABOND ADVANCED .7 DNX12 (GAUZE/BANDAGES/DRESSINGS) IMPLANT
DRAPE C-ARM 42X120 X-RAY (DRAPES) IMPLANT
DRSG TEGADERM 2-3/8X2-3/4 SM (GAUZE/BANDAGES/DRESSINGS) ×3 IMPLANT
DRSG TEGADERM 4X4.75 (GAUZE/BANDAGES/DRESSINGS) ×1 IMPLANT
ELECT REM PT RETURN 15FT ADLT (MISCELLANEOUS) ×1 IMPLANT
GAUZE SPONGE 2X2 8PLY STRL LF (GAUZE/BANDAGES/DRESSINGS) ×1 IMPLANT
GLOVE BIO SURGEON STRL SZ7.5 (GLOVE) ×1 IMPLANT
GLOVE INDICATOR 8.0 STRL GRN (GLOVE) ×1 IMPLANT
GOWN STRL REUS W/ TWL XL LVL3 (GOWN DISPOSABLE) ×1 IMPLANT
GOWN STRL REUS W/TWL XL LVL3 (GOWN DISPOSABLE) ×1
GRASPER SUT TROCAR 14GX15 (MISCELLANEOUS) IMPLANT
HEMOSTAT ARISTA ABSORB 3G PWDR (HEMOSTASIS) IMPLANT
HEMOSTAT SNOW SURGICEL 2X4 (HEMOSTASIS) IMPLANT
IRRIG SUCT STRYKERFLOW 2 WTIP (MISCELLANEOUS) ×1
IRRIGATION SUCT STRKRFLW 2 WTP (MISCELLANEOUS) ×1 IMPLANT
KIT BASIN OR (CUSTOM PROCEDURE TRAY) ×1 IMPLANT
KIT TURNOVER KIT A (KITS) IMPLANT
L-HOOK LAP DISP 36CM (ELECTROSURGICAL)
LHOOK LAP DISP 36CM (ELECTROSURGICAL) IMPLANT
PENCIL SMOKE EVACUATOR (MISCELLANEOUS) IMPLANT
POUCH RETRIEVAL ECOSAC 10 (ENDOMECHANICALS) ×1 IMPLANT
SCISSORS LAP 5X35 DISP (ENDOMECHANICALS) ×1 IMPLANT
SET CHOLANGIOGRAPH MIX (MISCELLANEOUS) IMPLANT
SET TUBE SMOKE EVAC HIGH FLOW (TUBING) ×1 IMPLANT
SLEEVE ADV FIXATION 5X100MM (TROCAR) IMPLANT
SLEEVE Z-THREAD 5X100MM (TROCAR) ×2 IMPLANT
SPIKE FLUID TRANSFER (MISCELLANEOUS) ×1 IMPLANT
STRIP CLOSURE SKIN 1/2X4 (GAUZE/BANDAGES/DRESSINGS) ×1 IMPLANT
SUT MNCRL AB 4-0 PS2 18 (SUTURE) ×1 IMPLANT
SUT VIC AB 0 UR5 27 (SUTURE) IMPLANT
SUT VICRYL 0 TIES 12 18 (SUTURE) IMPLANT
SUT VICRYL 0 UR6 27IN ABS (SUTURE) IMPLANT
TOWEL OR 17X26 10 PK STRL BLUE (TOWEL DISPOSABLE) ×1 IMPLANT
TOWEL OR NON WOVEN STRL DISP B (DISPOSABLE) ×1 IMPLANT
TRAY LAPAROSCOPIC (CUSTOM PROCEDURE TRAY) ×1 IMPLANT
TROCAR 11X100 Z THREAD (TROCAR) IMPLANT
TROCAR ADV FIXATION 5X100MM (TROCAR) IMPLANT
TROCAR BALLN 12MMX100 BLUNT (TROCAR) ×1 IMPLANT
TROCAR Z-THREAD OPTICAL 5X100M (TROCAR) ×1 IMPLANT

## 2022-08-23 NOTE — Interval H&P Note (Signed)
History and Physical Interval Note:  08/23/2022 12:47 PM  Erik West  has presented today for surgery, with the diagnosis of acute cholecysitits.  The various methods of treatment have been discussed with the patient and family. After consideration of risks, benefits and other options for treatment, the patient has consented to  Procedure(s): LAPAROSCOPIC CHOLECYSTECTOMY (N/A) as a surgical intervention.  The patient's history has been reviewed, patient examined, no change in status, stable for surgery.  I have reviewed the patient's chart and labs.  Questions were answered to the patient's satisfaction.    Lap  chole with icg dye  I believe the patient's symptoms are consistent with gallbladder disease.  I reviewed his CT imaging, and Dr. Rosezena Sensor note and labs.  While there is inflammation of the gallbladder I do not think it is prohibitive from going forward with cholecystectomy as opposed to cholecystostomy tube placement  We discussed gallbladder disease.   I discussed laparoscopic cholecystectomy withicg dye and possible IOC in detail.  The patient was shown diagrams detailing the procedure.  We discussed the risks and benefits of a laparoscopic cholecystectomy including, but not limited to bleeding, infection, injury to surrounding structures such as the intestine or liver, bile leak, retained gallstones, need to convert to an open procedure, prolonged diarrhea, blood clots such as  DVT, common bile duct injury, anesthesia risks, and possible need for additional procedures.  We discussed the typical post-operative recovery course. I explained that the likelihood of improvement of their symptoms is good.  I did discuss with the patient that he is at slight increased risk for fenestrated cholecystectomy, bile leak and possible surgical drain  He voiced understanding   Gaynelle Adu

## 2022-08-23 NOTE — Progress Notes (Signed)
Pt refused cpap for tonight 

## 2022-08-23 NOTE — Op Note (Signed)
Erik West [id number] Jan 03, 1981 08/23/2022  Laparoscopic Cholecystectomy with near infrared fluorescent cholangiography -with 22 modifier procedure Note  Indications: This patient presents with symptomatic gallbladder disease and will undergo laparoscopic cholecystectomy.  Pre-operative Diagnosis: Calculus of gallbladder with acute cholecystitis, without mention of obstruction  Post-operative Diagnosis: Calculus of gallbladder with acute cholecystitis, without mention of obstruction  Surgeon: Gaynelle Adu MD FACS  Assistants: Trixie Deis PA-C  Anesthesia: General endotracheal anesthesia  Procedure Details  The patient was seen again in the Holding Room. The risks, benefits, complications, treatment options, and expected outcomes were discussed with the patient. The possibilities of reaction to medication, pulmonary aspiration, perforation of viscus, bleeding, recurrent infection, finding a normal gallbladder, the need for additional procedures, failure to diagnose a condition, the possible need to convert to an open procedure, and creating a complication requiring transfusion or operation were discussed with the patient. The likelihood of improving the patient's symptoms with return to their baseline status is good.  The patient and/or family concurred with the proposed plan, giving informed consent. The site of surgery properly noted. The patient was taken to Operating Room, identified as Erik West and the procedure verified as Laparoscopic Cholecystectomy with ICG dye.  A Time Out was held and the above information confirmed. Antibiotic prophylaxis was administered.    ICG dye was administered preoperatively.    General endotracheal anesthesia was then administered and tolerated well. After the induction, the abdomen was prepped with Chloraprep and draped in the sterile fashion. The patient was positioned in the supine position.  Local anesthetic agent was injected into the  skin above the umbilicus and an incision made. We dissected down to the abdominal fascia with blunt dissection.  Patient had a pre-existing small supraumbilical fascial defect with a plug of preperitoneal fat.  I ended up amputating that with electrocautery and using the small fascial defect and further incised that and entered the peritoneal cavity.   A pursestring suture of 0-Vicryl was placed around the fascial opening.  The Hasson cannula was inserted and secured with the stay suture.  Pneumoperitoneum was then created with CO2 and tolerated well without any adverse changes in the patient's vital signs. An 5-mm port was placed in the subxiphoid position.  Two 5-mm ports were placed in the right upper quadrant. All skin incisions were infiltrated with a local anesthetic agent before making the incision and placing the trocars.   We positioned the patient in reverse Trendelenburg, tilted slightly to the patient's left.  The gallbladder was very edematous and thick-walled and distended.  I aspirated the gallbladder to facilitate retraction.  The patient had a very long gallbladder.  In retracting the gallbladder a small tear was made in the liver which was not an unexpected occurrence.  There is no significant bleeding from this location.  The tear was probably about half a centimeter.  The gallbladder was identified, the fundus grasped and retracted cephalad. Adhesions were lysed bluntly and with the electrocautery where indicated, taking care not to injure any adjacent organs or viscus. The infundibulum was grasped and retracted laterally, exposing the peritoneum overlying the triangle of Calot. This was then divided and exposed in a blunt fashion.  It took some time to achieve the critical view.  The patient had dense inflammation around the infundibulum and triangle of Calot.  This dissection took longer than typical so therefore 22 modifier should be applied.  It was technically more challenging due to the  added length of time, severe edema  and inflammation of the tissue.  A critical view of the cystic duct and cystic artery was obtained.  The cystic duct was clearly identified and bluntly dissected circumferentially.  Utilizing the Stryker camera system near infrared fluorescent activity was visualized in the liver, cystic duct, common hepatic duct and common bile duct.  This served as a secondary confirmation of our anatomy.  The cystic duct was then ligated with clips and divided. The cystic artery which had been identified & dissected free was ligated with clips and divided as well.   The gallbladder was dissected from the liver bed in retrograde fashion with the electrocautery.  It also took some time to separate the gallbladder from the liver surface.  The posterior wall was densely adherent to the liver surface.  There was some bleeding from 1 section of the liver on the right side.  Hemostasis was achieved with high electrocautery.  The gallbladder was removed and placed in an Ecco sac.  The gallbladder and Ecco sac were then removed through the umbilical port site. The liver bed was irrigated and inspected. Hemostasis was achieved with the electrocautery. Copious irrigation was utilized and was repeatedly aspirated until clear.  The pursestring suture was used to close the umbilical fascia.  2 additional interrupted 0 Vicryl's were placed on the umbilical fascia using a PMI suture passer with laparoscopic guidance.  We again inspected the right upper quadrant for hemostasis.  The umbilical closure was inspected and there was no air leak and nothing trapped within the closure. Pneumoperitoneum was released as we removed the trocars.  4-0 Monocryl was used to close the skin.   Dermabond was applied. The patient was then extubated and brought to the recovery room in stable condition. Instrument, sponge, and needle counts were correct at closure and at the conclusion of the case.   Findings: Severe acute  cholecystitis with Cholelithiasis 22 modifier should be applied due to the increased complexity of the case and technical difficulty given the severe inflammation and added time to take to do the procedure  Estimated Blood Loss: 100 cc         Drains: none         Specimens: Gallbladder           Complications: None; patient tolerated the procedure well.         Disposition: PACU - hemodynamically stable.         Condition: stable  Mary Sella. Andrey Campanile, MD, FACS General, Bariatric, & Minimally Invasive Surgery [facility] Surgery,  [facility]

## 2022-08-23 NOTE — Anesthesia Procedure Notes (Signed)
Procedure Name: Intubation Date/Time: 08/23/2022 1:53 PM  Performed by: Deri Fuelling, CRNAPre-anesthesia Checklist: Patient identified, Emergency Drugs available, Suction available and Patient being monitored Patient Re-evaluated:Patient Re-evaluated prior to induction Oxygen Delivery Method: Circle system utilized Preoxygenation: Pre-oxygenation with 100% oxygen Induction Type: IV induction Ventilation: Mask ventilation without difficulty Laryngoscope Size: Glidescope and 4 Grade View: Grade I Tube type: Oral Number of attempts: 1 Airway Equipment and Method: Stylet and Oral airway Placement Confirmation: ETT inserted through vocal cords under direct vision, positive ETCO2 and breath sounds checked- equal and bilateral Secured at: 23 cm Tube secured with: Tape Dental Injury: Teeth and Oropharynx as per pre-operative assessment

## 2022-08-23 NOTE — ED Notes (Signed)
ED TO INPATIENT HANDOFF REPORT  Name/Age/Gender Erik West 42 y.o. male  Code Status    Code Status Orders  (From admission, onward)           Start     Ordered   08/23/22 0026  Full code  Continuous       Question:  By:  Answer:  Other   08/23/22 0025           Code Status History     Date Active Date Inactive Code Status Order ID Comments User Context   07/03/2019 1309 07/04/2019 1750 Full Code [id number]  Georga Bora, PA-C Inpatient       Home/SNF/Other Home  Chief Complaint Acute cholecystitis [K81.0]  Level of Care/Admitting Diagnosis ED Disposition     ED Disposition  Admit   Condition  --   Comment  Hospital [facility] [[id number]]  Level of Care: Med-Surg [16]  May admit patient to [facility] or [facility] if equivalent level of care is available:: No  Covid Evaluation: Asymptomatic - no recent exposure (last 10 days) testing not required  Diagnosis: Acute cholecystitis [575.0.ICD-9-CM]  Admitting Physician: CCS, MD [[id number]]  Attending Physician: CCS, MD [[id number]]  Certification:: I certify this patient will need inpatient services for at least 2 midnights  Estimated Length of Stay: 2          Medical History Past Medical History:  Diagnosis Date   Essential hypertension    Hypersomnia    Sleep apnea    diagnosed "maybe a year ago" uses CPAP    Allergies No Known Allergies  IV Location/Drains/Wounds Patient Lines/Drains/Airways Status     Active Line/Drains/Airways     Name Placement date Placement time Site Days   Peripheral IV 08/22/22 20 G Right Antecubital 08/22/22  1746  Antecubital  1   Peripheral IV 08/23/22 22 G Anterior;Right Hand 08/23/22  0046  Hand  less than 1   Incision (Closed) 07/03/19 Neck 07/03/19  1101  -- 1147            Labs/Imaging Results for orders placed or performed during the hospital encounter of 08/22/22 (from the past 48 hour(s))  Urinalysis, Routine w reflex  microscopic -Urine, Clean Catch     Status: Abnormal   Collection Time: 08/22/22  5:25 PM  Result Value Ref Range   Color, Urine YELLOW YELLOW   APPearance CLEAR CLEAR   Specific Gravity, Urine >1.046 (H) 1.005 - 1.030   pH 7.0 5.0 - 8.0   Glucose, UA NEGATIVE NEGATIVE mg/dL   Hgb urine dipstick NEGATIVE NEGATIVE   Bilirubin Urine NEGATIVE NEGATIVE   Ketones, ur NEGATIVE NEGATIVE mg/dL   Protein, ur NEGATIVE NEGATIVE mg/dL   Nitrite NEGATIVE NEGATIVE   Leukocytes,Ua NEGATIVE NEGATIVE    Comment: Performed at [facility], [address], [place], [place] [address]  Lipase, blood     Status: None   Collection Time: 08/22/22  5:48 PM  Result Value Ref Range   Lipase 24 11 - 51 U/L    Comment: Performed at [facility], [address], [place], [place] [address]  Comprehensive metabolic panel     Status: Abnormal   Collection Time: 08/22/22  5:48 PM  Result Value Ref Range   Sodium 134 (L) 135 - 145 mmol/L   Potassium 4.2 3.5 - 5.1 mmol/L   Chloride 99 98 - 111 mmol/L   CO2 27 22 - 32 mmol/L  Glucose, Bld 98 70 - 99 mg/dL    Comment: Glucose reference range applies only to samples taken after fasting for at least 8 hours.   BUN 12 6 - 20 mg/dL   Creatinine, Ser 8.29 (H) 0.61 - 1.24 mg/dL   Calcium 8.8 (L) 8.9 - 10.3 mg/dL   Total Protein 7.8 6.5 - 8.1 g/dL   Albumin 3.8 3.5 - 5.0 g/dL   AST 16 15 - 41 U/L   ALT 26 0 - 44 U/L   Alkaline Phosphatase 78 38 - 126 U/L   Total Bilirubin 0.8 0.3 - 1.2 mg/dL   GFR, Estimated >56 >21 mL/min    Comment: (NOTE) Calculated using the CKD-EPI Creatinine Equation (2021)    Anion gap 8 5 - 15    Comment: Performed at [facility], [address] [place], [place] [address]  CBC     Status: None   Collection Time: 08/22/22  5:48 PM  Result Value Ref Range   WBC 10.0 4.0 - 10.5 K/uL   RBC 5.40 4.22 - 5.81 MIL/uL   Hemoglobin 15.4 13.0 - 17.0 g/dL   HCT 78.4 69.6 - 29.5 %   MCV  89.8 80.0 - 100.0 fL   MCH 28.5 26.0 - 34.0 pg   MCHC 31.8 30.0 - 36.0 g/dL   RDW 28.4 13.2 - 44.0 %   Platelets 246 150 - 400 K/uL   nRBC 0.0 0.0 - 0.2 %    Comment: Performed at [facility], [address] [place], [place] [address]   US Abdomen Limited RUQ (LIVER/GB)  Result Date: 08/22/2022 CLINICAL DATA:  Right upper quadrant pain. EXAM: ULTRASOUND ABDOMEN LIMITED RIGHT UPPER QUADRANT COMPARISON:  CT 08/22/2022 FINDINGS: Gallbladder: Distended gallbladder with stones towards the neck. There is wall thickening and wall edema. Separate sludge. Common bile duct: Diameter: 7 mm.  Upper limits of normal. Liver: Tiny left hepatic lobe cyst as seen on CT. Within normal limits in parenchymal echogenicity. Portal vein is patent on color Doppler imaging with normal direction of blood flow towards the liver. Other: None. IMPRESSION: Dilated gallbladder with sludge and stones towards the neck. There is also significant wall thickening wall edema. Findings are worrisome for acute cholecystitis. Please correlate with symptomatology. Confirmatory study with HIDA scan can be performed if there is further clinical need for imaging. Common duct at the upper limits of normal for age. Electronically Signed   By: Karen Kays M.D.   On: 08/22/2022 20:18   CT ABDOMEN PELVIS W CONTRAST  Result Date: 08/22/2022 CLINICAL DATA:  Acute abdominal pain.  Dysuria.  Constipation. EXAM: CT ABDOMEN AND PELVIS WITH CONTRAST TECHNIQUE: Multidetector CT imaging of the abdomen and pelvis was performed using the standard protocol following bolus administration of intravenous contrast. RADIATION DOSE REDUCTION: This exam was performed according to the departmental dose-optimization program which includes automated exposure control, adjustment of the mA and/or kV according to patient size and/or use of iterative reconstruction technique. CONTRAST:  OMNIPAQUE IOHEXOL 300 MG/ML  SOLN COMPARISON:  None Available.  FINDINGS: Lower Chest: No acute findings. Hepatobiliary: No hepatic masses identified. A few small hepatic cysts are noted. Gallbladder is distended and shows diffuse wall thickening and mild mucosal enhancement, consistent with acute cholecystitis. No evidence of biliary ductal dilatation. Pancreas:  No mass or inflammatory changes. Spleen: Within normal limits in size and appearance. Adrenals/Urinary Tract: No suspicious masses identified. No evidence of ureteral calculi or hydronephrosis. Stomach/Bowel: No evidence of obstruction, inflammatory  process or abnormal fluid collections. Normal appendix visualized. Vascular/Lymphatic: No pathologically enlarged lymph nodes. No acute vascular findings. Reproductive:  No mass or other significant abnormality. Other:  None. Musculoskeletal:  No suspicious bone lesions identified. IMPRESSION: Findings consistent with acute cholecystitis. No evidence of biliary ductal dilatation. Electronically Signed   By: Danae Orleans M.D.   On: 08/22/2022 19:08    Pending Labs Unresulted Labs (From admission, onward)     Start     Ordered   08/23/22 0500  CBC  Tomorrow morning,   R        08/23/22 0025   Signed and Held  Comprehensive metabolic panel  Tomorrow morning,   R        Signed and Held            Vitals/Pain Today's Vitals   08/22/22 2100 08/22/22 2112 08/23/22 0015 08/23/22 0054  BP: (!) 146/94  (!) 141/97   Pulse: 90  94   Resp: 17  18   Temp:  98.4 F (36.9 C)  98.1 F (36.7 C)  TempSrc:  Oral  Oral  SpO2: 96%  95%   PainSc:        Isolation Precautions No active isolations  Medications Medications  lisinopril (ZESTRIL) tablet 10 mg (has no administration in time range)  enoxaparin (LOVENOX) injection 40 mg (has no administration in time range)  HYDROmorphone (DILAUDID) injection 1 mg (1 mg Intravenous Given 08/23/22 0040)  oxyCODONE-acetaminophen (PERCOCET/ROXICET) 5-325 MG per tablet 1-2 tablet (has no administration in time range)   methocarbamol (ROBAXIN) tablet 500 mg (has no administration in time range)    Or  methocarbamol (ROBAXIN) 500 mg in dextrose 5 % 50 mL IVPB (has no administration in time range)  zolpidem (AMBIEN) tablet 5 mg (has no administration in time range)  ondansetron (ZOFRAN-ODT) disintegrating tablet 4 mg (has no administration in time range)    Or  ondansetron (ZOFRAN) injection 4 mg (has no administration in time range)  dextrose 5 %-0.9 % sodium chloride infusion ( Intravenous New Bag/Given 08/23/22 0052)  piperacillin-tazobactam (ZOSYN) IVPB 3.375 g (has no administration in time range)  sodium chloride 0.9 % bolus 1,000 mL (0 mLs Intravenous Stopped 08/22/22 1851)  acetaminophen (TYLENOL) tablet 650 mg (650 mg Oral Given 08/22/22 1746)  morphine (PF) 4 MG/ML injection 4 mg (4 mg Intravenous Given 08/22/22 1746)  iohexol (OMNIPAQUE) 300 MG/ML solution 100 mL (100 mLs Intravenous Contrast Given 08/22/22 1835)  hydrALAZINE (APRESOLINE) injection 10 mg (10 mg Intravenous Given 08/22/22 1950)  morphine (PF) 4 MG/ML injection 4 mg (4 mg Intravenous Given 08/22/22 1950)  piperacillin-tazobactam (ZOSYN) IVPB 3.375 g (0 g Intravenous Stopped 08/22/22 2036)    Mobility walks

## 2022-08-23 NOTE — Transfer of Care (Signed)
Immediate Anesthesia Transfer of Care Note  Patient: Erik West  Procedure(s) Performed: LAPAROSCOPIC CHOLECYSTECTOMY  Patient Location: PACU  Anesthesia Type:General  Level of Consciousness: oriented and sedated  Airway & Oxygen Therapy: Patient Spontanous Breathing and Patient connected to face mask oxygen  Post-op Assessment: Report given to RN and Post -op Vital signs reviewed and stable  Post vital signs: Reviewed and stable  Last Vitals:  Vitals Value Taken Time  BP 151/102 08/23/22 1535  Temp    Pulse 98 08/23/22 1540  Resp 15 08/23/22 1540  SpO2 99 % 08/23/22 1540  Vitals shown include unvalidated device data.  Last Pain:  Vitals:   08/23/22 1251  TempSrc:   PainSc: 2       Patients Stated Pain Goal: 2 (08/23/22 0443)  Complications: No notable events documented.

## 2022-08-23 NOTE — Anesthesia Preprocedure Evaluation (Addendum)
Anesthesia Evaluation  Patient identified by MRN, date of birth, ID band  Reviewed: Allergy & Precautions, NPO status , Patient's Chart, lab work & pertinent test results  Airway Mallampati: II  TM Distance: >3 FB Neck ROM: Full    Dental no notable dental hx.    Pulmonary sleep apnea    Pulmonary exam normal        Cardiovascular hypertension, Pt. on medications Normal cardiovascular exam     Neuro/Psych  Neuromuscular disease  negative psych ROS   GI/Hepatic negative GI ROS, Neg liver ROS,,,  Endo/Other  negative endocrine ROS    Renal/GU negative Renal ROS     Musculoskeletal negative musculoskeletal ROS (+)    Abdominal  (+) + obese  Peds  Hematology negative hematology ROS (+)   Anesthesia Other Findings acute cholecysitits  Reproductive/Obstetrics                             Anesthesia Physical Anesthesia Plan  ASA: 2  Anesthesia Plan: General   Post-op Pain Management:    Induction: Intravenous  PONV Risk Score and Plan: 3 and Ondansetron, Dexamethasone, Midazolam and Treatment may vary due to age or medical condition  Airway Management Planned: Oral ETT  Additional Equipment:   Intra-op Plan:   Post-operative Plan: Extubation in OR  Informed Consent: I have reviewed the patients History and Physical, chart, labs and discussed the procedure including the risks, benefits and alternatives for the proposed anesthesia with the patient or authorized representative who has indicated his/her understanding and acceptance.     Dental advisory given  Plan Discussed with: CRNA  Anesthesia Plan Comments:        Anesthesia Quick Evaluation

## 2022-08-23 NOTE — Progress Notes (Signed)
42 year old male patient admitted for abdominal pain on 08/22/22. Stated the last bowel movement was on 08/20/22. Pt is scheduled to have a laparoscopic cholecystectomy this afternoon. Pt received CHG bath at 11:15 before surgery.   Pt is alert and oriented x4 with clear speech. Muscle strength is equal bilaterally. S1 and S2 sounds are present with no murmurs. Chest expansion is normal. Pt has regular unlabored breathing. Pt states he has a level 2 pain currently and does not need medication. When pain is present it is in the lower abdomen and radiates to the right side. Muscle strength is equal bilaterally.   Barnett Abu, Student RN

## 2022-08-23 NOTE — Anesthesia Postprocedure Evaluation (Signed)
Anesthesia Post Note  Patient: Erik West  Procedure(s) Performed: LAPAROSCOPIC CHOLECYSTECTOMY     Patient location during evaluation: PACU Anesthesia Type: General Level of consciousness: awake Pain management: pain level controlled Vital Signs Assessment: post-procedure vital signs reviewed and stable Respiratory status: spontaneous breathing, nonlabored ventilation and respiratory function stable Cardiovascular status: blood pressure returned to baseline and stable Postop Assessment: no apparent nausea or vomiting Anesthetic complications: no   No notable events documented.  Last Vitals:  Vitals:   08/23/22 1630 08/23/22 1655  BP: (!) 141/96 (!) 144/98  Pulse: 96 98  Resp: 18 16  Temp:  36.7 C  SpO2: 94% 96%    Last Pain:  Vitals:   08/23/22 1655  TempSrc: Oral  PainSc: 5                  Morgyn Marut P Chun Sellen

## 2022-08-24 ENCOUNTER — Encounter (HOSPITAL_COMMUNITY): Payer: Self-pay | Admitting: General Surgery

## 2022-08-24 LAB — COMPREHENSIVE METABOLIC PANEL
ALT: 96 U/L — ABNORMAL HIGH (ref 0–44)
AST: 58 U/L — ABNORMAL HIGH (ref 15–41)
Albumin: 3.3 g/dL — ABNORMAL LOW (ref 3.5–5.0)
Alkaline Phosphatase: 92 U/L (ref 38–126)
Anion gap: 7 (ref 5–15)
BUN: 13 mg/dL (ref 6–20)
CO2: 24 mmol/L (ref 22–32)
Calcium: 8.6 mg/dL — ABNORMAL LOW (ref 8.9–10.3)
Chloride: 99 mmol/L (ref 98–111)
Creatinine, Ser: 1.2 mg/dL (ref 0.61–1.24)
GFR, Estimated: >60 mL/min (ref >60)
Glucose, Bld: 123 mg/dL — ABNORMAL HIGH (ref 70–99)
Potassium: 4 mmol/L (ref 3.5–5.1)
Sodium: 130 mmol/L — ABNORMAL LOW (ref 135–145)
Total Bilirubin: 0.8 mg/dL (ref 0.3–1.2)
Total Protein: 7.5 g/dL (ref 6.5–8.1)

## 2022-08-24 LAB — CBC
HCT: 44.8 % (ref 39.0–52.0)
Hemoglobin: 14.2 g/dL (ref 13.0–17.0)
MCH: 28.3 pg (ref 26.0–34.0)
MCHC: 31.7 g/dL (ref 30.0–36.0)
MCV: 89.2 fL (ref 80.0–100.0)
Platelets: 281 10*3/uL (ref 150–400)
RBC: 5.02 MIL/uL (ref 4.22–5.81)
RDW: 13.7 % (ref 11.5–15.5)
WBC: 12.9 10*3/uL — ABNORMAL HIGH (ref 4.0–10.5)
nRBC: 0 % (ref 0.0–0.2)

## 2022-08-24 MED ORDER — FLEET ENEMA 7-19 GM/118ML RE ENEM: 1.0000 | ENEMA | Freq: Every day | RECTAL | Status: DC | PRN

## 2022-08-24 MED ORDER — OXYCODONE HCL 5 MG PO TABS: 5.0000 mg | ORAL_TABLET | ORAL | 0 refills | Status: DC | PRN

## 2022-08-24 MED ORDER — BISACODYL 10 MG RE SUPP
10.0000 mg | Freq: Once | RECTAL | Status: DC
  Filled 2022-08-24: qty 1

## 2022-08-24 MED ORDER — POLYETHYLENE GLYCOL 3350 17 G PO PACK
17.0000 g | PACK | Freq: Every day | ORAL | Status: DC
  Administered 2022-08-24: 17 g via ORAL
  Filled 2022-08-24: qty 1

## 2022-08-24 NOTE — Discharge Summary (Addendum)
    Patient ID: Erik West [id number] 1980-05-17 42 y.o.  Admit date: 08/22/2022 Discharge date: 08/24/2022  Admitting Diagnosis: cholecystitis  Discharge Diagnosis Patient Active Problem List   Diagnosis Date Noted   Acute cholecystitis 08/23/2022   Bruising 03/22/2022   Elevated cholesterol 03/22/2020   BMI 30.0-30.9,adult 12/18/2019   Radiculopathy 07/03/2019   Essential hypertension 09/23/2018   Elevated blood pressure reading 05/07/2018   Cervicalgia 04/22/2018   Muscle spasms of neck 04/22/2018   OSA (obstructive sleep apnea) 03/11/2018   Tonsillar hypertrophy 03/11/2018   Seasonal allergies    Snores     Consultants none  Reason for Admission: 3 day hx of RUQ abdominal pain. Thought it was constipation but suppositories and mag citrate but did not help. PAIN SHARP AND SEVERE, RUQ 7/10 no n/v fever or chills   Procedures Lap chole with ICG dye, Dr. Andrey Campanile 5/15  Hospital Course:  The patient was admitted and underwent a laparoscopic cholecystectomy with ICG dye.  The patient tolerated the procedure well.  On POD 1, the patient was tolerating a regular diet, voiding well, mobilizing, and pain was controlled with oral pain medications.  The patient was stable for DC home at this time with appropriate follow up made.   Physical Exam: Abd: soft, appropriately tender, +BS, ND, incisions c/d/i  Allergies as of 08/24/2022   No Known Allergies      Medication List     TAKE these medications    albuterol 108 (90 Base) MCG/ACT inhaler Commonly known as: VENTOLIN HFA Inhale 2 puffs into the lungs every 6 (six) hours as needed for wheezing or shortness of breath.   amLODipine 2.5 MG tablet Commonly known as: NORVASC Take 1 tablet (2.5 mg total) by mouth daily.   cetirizine 10 MG tablet Commonly known as: ZYRTEC TAKE 1 TABLET BY MOUTH EVERY DAY What changed:  when to take this reasons to take this   fluticasone 50 MCG/ACT nasal spray Commonly known  as: FLONASE Place 1 spray into both nostrils 2 (two) times daily as needed for rhinitis or allergies.   lisinopril 10 MG tablet Commonly known as: ZESTRIL Take 1 tablet (10 mg total) by mouth daily.   methocarbamol 500 MG tablet Commonly known as: ROBAXIN Take 500 mg by mouth every 6 (six) hours as needed for muscle spasms (or tension).   multivitamin Tabs tablet Take 1 tablet by mouth daily with breakfast.   ofloxacin 0.3 % ophthalmic solution Commonly known as: OCUFLOX Place 2 drops into both eyes every 6 (six) hours.   oxyCODONE 5 MG immediate release tablet Commonly known as: Oxy IR/ROXICODONE Take 1 tablet (5 mg total) by mouth every 4 (four) hours as needed for moderate pain.   TYLENOL 500 MG tablet Generic drug: acetaminophen Take 500-1,000 mg by mouth every 6 (six) hours as needed for mild pain or headache.          Follow-up Information     Maczis, Hedda Slade, PA-C Follow up on 09/19/2022.   Specialty: General Surgery Why: 11:30am, Arrive 30 minutes prior to your appointment time, Please bring your insurance card and photo ID Contact information: [address] [place] [address] [facility] [place] [address] [phone]                 Signed: Barnetta Chapel, [facility] 08/24/2022, 10:42 AM Please see Amion for pager number during day hours 7:00am-4:30pm, 7-11:30am on Weekends

## 2022-08-24 NOTE — Discharge Instructions (Signed)
[facility] ° °Please arrive at least 30 min before your appointment to complete your check in paperwork.  If you are unable to arrive 30 min prior to your appointment time we may have to cancel or reschedule you. °LAPAROSCOPIC SURGERY: POST OP INSTRUCTIONS °Always review your discharge instruction sheet given to you by the facility where your surgery was performed. °IF YOU HAVE DISABILITY OR FAMILY LEAVE FORMS, YOU MUST BRING THEM TO THE OFFICE FOR PROCESSING.   °DO NOT GIVE THEM TO YOUR DOCTOR. ° °PAIN CONTROL ° °First take acetaminophen (Tylenol) AND/or ibuprofen (Advil) to control your pain after surgery.  Follow directions on package.  Taking acetaminophen (Tylenol) and/or ibuprofen (Advil) regularly after surgery will help to control your pain and lower the amount of prescription pain medication you may need.  You should not take more than 4,000 mg (4 grams) of acetaminophen (Tylenol) in 24 hours.  You should not take ibuprofen (Advil), aleve, motrin, naprosyn or other NSAIDS if you have a history of stomach ulcers or chronic kidney disease.  °A prescription for pain medication may be given to you upon discharge.  Take your pain medication as prescribed, if you still have uncontrolled pain after taking acetaminophen (Tylenol) or ibuprofen (Advil). °Use ice packs to help control pain. °If you need a refill on your pain medication, please contact your pharmacy.  They will contact our office to request authorization. Prescriptions will not be filled after 5pm or on week-ends. ° °HOME MEDICATIONS °Take your usually prescribed medications unless otherwise directed. ° °DIET °You should follow a light diet the first few days after arrival home.  Be sure to include lots of fluids daily. Avoid fatty, fried foods.  ° °CONSTIPATION °It is common to experience some constipation after surgery and if you are taking pain medication.  Increasing fluid intake and taking a stool softener (such as Colace)  will usually help or prevent this problem from occurring.  A mild laxative (Milk of Magnesia or Miralax) should be taken according to package instructions if there are no bowel movements after 48 hours. ° °WOUND/INCISION CARE °Most patients will experience some swelling and bruising in the area of the incisions.  Ice packs will help.  Swelling and bruising can take several days to resolve.  °Unless discharge instructions indicate otherwise, follow guidelines below  °STERI-STRIPS - you may remove your outer bandages 48 hours after surgery, and you may shower at that time.  You have steri-strips (small skin tapes) in place directly over the incision.  These strips should be left on the skin for 7-10 days.   °DERMABOND/SKIN GLUE - you may shower in 24 hours.  The glue will flake off over the next 2-3 weeks. °Any sutures or staples will be removed at the office during your follow-up visit. ° °ACTIVITIES °You may resume regular (light) daily activities beginning the next day--such as daily self-care, walking, climbing stairs--gradually increasing activities as tolerated.  You may have sexual intercourse when it is comfortable.  Refrain from any heavy lifting or straining until approved by your doctor. °You may drive when you are no longer taking prescription pain medication, you can comfortably wear a seatbelt, and you can safely maneuver your car and apply brakes. ° °FOLLOW-UP °You should see your doctor in the office for a follow-up appointment approximately 2-3 weeks after your surgery.  You should have been given your post-op/follow-up appointment when your surgery was scheduled.  If you did not receive a post-op/follow-up appointment, make sure   that you call for this appointment within a day or two after you arrive home to insure a convenient appointment time. ° ° °WHEN TO CALL YOUR DOCTOR: °Fever over 101.0 °Inability to urinate °Continued bleeding from incision. °Increased pain, redness, or drainage from the  incision. °Increasing abdominal pain ° °The clinic staff is available to answer your questions during regular business hours.  Please don’t hesitate to call and ask to speak to one of the nurses for clinical concerns.  If you have a medical emergency, go to the nearest emergency room or call 911.  A surgeon from [facility] is always on call at the hospital. °[address], [address], [place], [place]  [address] ? [address] [address], [place], [place]   [address] °[phone] ? [phone] ? FAX [phone] ° ° ° ° °Managing Your Pain After Surgery Without Opioids ° ° ° °Thank you for participating in our program to help patients manage their pain after surgery without opioids. This is part of our effort to provide you with the best care possible, without exposing you or your family to the risk that opioids pose. ° °What pain can I expect after surgery? °You can expect to have some pain after surgery. This is normal. The pain is typically worse the day after surgery, and quickly begins to get better. °Many studies have found that many patients are able to manage their pain after surgery with Over-the-Counter (OTC) medications such as Tylenol and Motrin. If you have a condition that does not allow you to take Tylenol or Motrin, notify your surgical team. ° °How will I manage my pain? °The best strategy for controlling your pain after surgery is around the clock pain control with Tylenol (acetaminophen) and Motrin (ibuprofen or Advil). Alternating these medications with each other allows you to maximize your pain control. In addition to Tylenol and Motrin, you can use heating pads or ice packs on your incisions to help reduce your pain. ° °How will I alternate your regular strength over-the-counter pain medication? °You will take a dose of pain medication every three hours. °Start by taking 650 mg of Tylenol (2 pills of 325 mg) °3 hours later take 600 mg of Motrin (3 pills of 200 mg) °3 hours after  taking the Motrin take 650 mg of Tylenol °3 hours after that take 600 mg of Motrin. ° ° °- 1 - ° °See example - if your first dose of Tylenol is at 12:00 PM ° ° °12:00 PM Tylenol 650 mg (2 pills of 325 mg)  °3:00 PM Motrin 600 mg (3 pills of 200 mg)  °6:00 PM Tylenol 650 mg (2 pills of 325 mg)  °9:00 PM Motrin 600 mg (3 pills of 200 mg)  °Continue alternating every 3 hours  ° °We recommend that you follow this schedule around-the-clock for at least 3 days after surgery, or until you feel that it is no longer needed. Use the table on the last page of this handout to keep track of the medications you are taking. °Important: °Do not take more than 3000mg of Tylenol or 3200mg of Motrin in a 24-hour period. °Do not take ibuprofen/Motrin if you have a history of bleeding stomach ulcers, severe kidney disease, &/or actively taking a blood thinner ° °What if I still have pain? °If you have pain that is not controlled with the over-the-counter pain medications (Tylenol and Motrin or Advil) you might have what we call “breakthrough” pain. You will receive a prescription   for a small amount of an opioid pain medication such as Oxycodone, Tramadol, or Tylenol with Codeine. Use these opioid pills in the first 24 hours after surgery if you have breakthrough pain. Do not take more than 1 pill every 4-6 hours. ° °If you still have uncontrolled pain after using all opioid pills, don't hesitate to call our staff using the number provided. We will help make sure you are managing your pain in the best way possible, and if necessary, we can provide a prescription for additional pain medication. ° ° °Day 1   ° °Time  °Name of Medication Number of pills taken  °Amount of Acetaminophen  °Pain Level  ° °Comments  °AM PM       °AM PM       °AM PM       °AM PM       °AM PM       °AM PM       °AM PM       °AM PM       °Total Daily amount of Acetaminophen °Do not take more than  3,000 mg per day    ° ° °Day 2   ° °Time  °Name of Medication  Number of pills °taken  °Amount of Acetaminophen  °Pain Level  ° °Comments  °AM PM       °AM PM       °AM PM       °AM PM       °AM PM       °AM PM       °AM PM       °AM PM       °Total Daily amount of Acetaminophen °Do not take more than  3,000 mg per day    ° ° °Day 3   ° °Time  °Name of Medication Number of pills taken  °Amount of Acetaminophen  °Pain Level  ° °Comments  °AM PM       °AM PM       °AM PM       °AM PM       ° ° ° °AM PM       °AM PM       °AM PM       °AM PM       °Total Daily amount of Acetaminophen °Do not take more than  3,000 mg per day    ° ° °Day 4   ° °Time  °Name of Medication Number of pills taken  °Amount of Acetaminophen  °Pain Level  ° °Comments  °AM PM       °AM PM       °AM PM       °AM PM       °AM PM       °AM PM       °AM PM       °AM PM       °Total Daily amount of Acetaminophen °Do not take more than  3,000 mg per day    ° ° °Day 5   ° °Time  °Name of Medication Number °of pills taken  °Amount of Acetaminophen  °Pain Level  ° °Comments  °AM PM       °AM PM       °AM PM       °AM PM       °AM PM       °AM   PM       °AM PM       °AM PM       °Total Daily amount of Acetaminophen °Do not take more than  3,000 mg per day    ° ° ° °Day 6   ° °Time  °Name of Medication Number of pills °taken  °Amount of Acetaminophen  °Pain Level  °Comments  °AM PM       °AM PM       °AM PM       °AM PM       °AM PM       °AM PM       °AM PM       °AM PM       °Total Daily amount of Acetaminophen °Do not take more than  3,000 mg per day    ° ° °Day 7   ° °Time  °Name of Medication Number of pills taken  °Amount of Acetaminophen  °Pain Level  ° °Comments  °AM PM       °AM PM       °AM PM       °AM PM       °AM PM       °AM PM       °AM PM       °AM PM       °Total Daily amount of Acetaminophen °Do not take more than  3,000 mg per day    ° ° ° ° °For additional information about how and where to safely dispose of unused opioid °medications - https://www.morepowerfulnc.org ° °Disclaimer: This document  contains information and/or instructional materials adapted from [facility] for the typical patient with your condition. It does not replace medical advice from your health care provider because your experience may differ from that of the °typical patient. Talk to your health care provider if you have any questions about this °document, your condition or your treatment plan. °Adapted from [facility] ° °

## 2022-08-24 NOTE — Progress Notes (Signed)
  Transition of Care [facility]) Screening Note   Patient Details  Name: Demarr Kangas Date of Birth: 08-Sep-1980   Transition of Care [facility]) CM/SW Contact:    Adrian Prows, RN Phone Number: 08/24/2022, 9:41 AM    Transition of Care Department [facility]) has reviewed patient and no TOC needs have been identified at this time. We will continue to monitor patient advancement through interdisciplinary progression rounds. If new patient transition needs arise, please place a TOC consult.

## 2022-08-24 NOTE — Progress Notes (Signed)
Patient was given discharge instructions, and all questions were answered.  Patient was stable for discharge and was taken to the main exit by wheelchair. 

## 2022-08-25 ENCOUNTER — Telehealth: Payer: Self-pay

## 2022-08-25 LAB — SURGICAL PATHOLOGY

## 2022-08-25 NOTE — Transitions of Care (Post Inpatient/ED Visit) (Signed)
   08/25/2022  Name: Erik West MRN: [id number] DOB: March 06, 1981  Today's TOC FU Call Status: Today's TOC FU Call Status:: Unsuccessul Call (1st Attempt) Unsuccessful Call (1st Attempt) Date: 08/25/22  Attempted to reach the patient regarding the most recent Inpatient/ED visit.  Follow Up Plan: Additional outreach attempts will be made to reach the patient to complete the Transitions of Care (Post Inpatient/ED visit) call.   Signature  Lisabeth Devoid

## 2022-10-18 ENCOUNTER — Encounter: Payer: Self-pay | Admitting: Nurse Practitioner

## 2022-10-18 ENCOUNTER — Ambulatory Visit: Payer: Managed Care, Other (non HMO) | Admitting: Nurse Practitioner

## 2022-10-18 NOTE — Progress Notes (Deleted)
[name], CMA,acting as a Neurosurgeon for [name], FNP.,have documented all relevant documentation on the behalf of [name], FNP,as directed by  [name], FNP while in the presence of [name], FNP.  Subjective:  Patient ID: Erik West , male    DOB: 11-28-1980 , 42 y.o.   MRN: [id number]  No chief complaint on file.   HPI  Patient presents today for a BP follow up. Patient reports compliance with medications. Patient has no concerns today, patient denies any chest pain, SOB, or headaches.  Patient reports his BP has been running high for about    Past Medical History:  Diagnosis Date  . Essential hypertension   . Hypersomnia   . Sleep apnea    diagnosed "maybe a year ago" uses CPAP     Family History  Problem Relation Age of Onset  . Hypertension Paternal Grandmother   . Cancer Other      Current Outpatient Medications:  .  albuterol (VENTOLIN HFA) 108 (90 Base) MCG/ACT inhaler, Inhale 2 puffs into the lungs every 6 (six) hours as needed for wheezing or shortness of breath., Disp: 8 g, Rfl: 2 .  amLODipine (NORVASC) 2.5 MG tablet, Take 1 tablet (2.5 mg total) by mouth daily. (Patient not taking: Reported on 08/22/2022), Disp: 90 tablet, Rfl: 1 .  cetirizine (ZYRTEC) 10 MG tablet, TAKE 1 TABLET BY MOUTH EVERY DAY (Patient taking differently: Take 10 mg by mouth daily as needed for allergies or rhinitis.), Disp: 90 tablet, Rfl: 0 .  fluticasone (FLONASE) 50 MCG/ACT nasal spray, Place 1 spray into both nostrils 2 (two) times daily as needed for rhinitis or allergies., Disp: , Rfl:  .  lisinopril (ZESTRIL) 10 MG tablet, Take 1 tablet (10 mg total) by mouth daily., Disp: 90 tablet, Rfl: 1 .  methocarbamol (ROBAXIN) 500 MG tablet, Take 500 mg by mouth every 6 (six) hours as needed for muscle spasms (or tension)., Disp: , Rfl:  .  multivitamin (ONE-A-DAY MEN'S) TABS tablet, Take 1 tablet by mouth daily with breakfast., Disp: , Rfl:  .  oxyCODONE (OXY  IR/ROXICODONE) 5 MG immediate release tablet, Take 1 tablet (5 mg total) by mouth every 4 (four) hours as needed for moderate pain., Disp: 15 tablet, Rfl: 0 .  TYLENOL 500 MG tablet, Take 500-1,000 mg by mouth every 6 (six) hours as needed for mild pain or headache., Disp: , Rfl:    No Known Allergies   Review of Systems   There were no vitals filed for this visit. There is no height or weight on file to calculate BMI.  Wt Readings from Last 3 Encounters:  08/23/22 225 lb (102.1 kg)  08/22/22 225 lb (102.1 kg)  03/22/22 215 lb (97.5 kg)    The 10-year ASCVD risk score (Arnett DK, et al., 2019) is: 12.7%   Values used to calculate the score:     Age: 62 years     Sex: Male     Is Non-Hispanic African American: Yes     Diabetic: No     Tobacco smoker: Yes     Systolic Blood Pressure: 149 mmHg     Is BP treated: Yes     HDL Cholesterol: 46 mg/dL     Total Cholesterol: 211 mg/dL  Objective:  Physical Exam      Assessment And Plan:  Essential hypertension    No follow-ups on file.  Patient was given opportunity to ask questions. Patient verbalized understanding of the plan  and was able to repeat key elements of the plan. All questions were answered to their satisfaction.    Jeanell Sparrow, FNP, have reviewed all documentation for this visit. The documentation on 10/18/22 for the exam, diagnosis, procedures, and orders are all accurate and complete.   IF YOU HAVE BEEN REFERRED TO A SPECIALIST, IT MAY TAKE 1-2 WEEKS TO SCHEDULE/PROCESS THE REFERRAL. IF YOU HAVE NOT HEARD FROM US/SPECIALIST IN TWO WEEKS, PLEASE GIVE [place] A CALL AT [phone] X [phone].

## 2022-10-19 ENCOUNTER — Encounter: Payer: Self-pay | Admitting: Nurse Practitioner

## 2022-10-19 ENCOUNTER — Ambulatory Visit: Payer: Managed Care, Other (non HMO) | Admitting: Nurse Practitioner

## 2022-10-19 VITALS — BP 160/90 | HR 86 | Temp 98.1°F | Ht 71.0 in | Wt 233.8 lb

## 2022-10-19 DIAGNOSIS — E6609 Other obesity due to excess calories: Secondary | ICD-10-CM | POA: Diagnosis not present

## 2022-10-19 DIAGNOSIS — I10 Essential (primary) hypertension: Secondary | ICD-10-CM | POA: Diagnosis not present

## 2022-10-19 DIAGNOSIS — Z2821 Immunization not carried out because of patient refusal: Secondary | ICD-10-CM

## 2022-10-19 DIAGNOSIS — Z6832 Body mass index (BMI) 32.0-32.9, adult: Secondary | ICD-10-CM

## 2022-10-19 MED ORDER — AMLODIPINE BESYLATE 2.5 MG PO TABS
2.5000 mg | ORAL_TABLET | Freq: Every day | ORAL | 1 refills | Status: DC
Start: 2022-10-19 — End: 2023-01-16

## 2022-10-19 MED ORDER — LISINOPRIL 10 MG PO TABS: 10.0000 mg | ORAL_TABLET | Freq: Every day | ORAL | 1 refills | Status: DC

## 2022-10-19 NOTE — Progress Notes (Signed)
[name], CMA,acting as a Neurosurgeon for [name], FNP.,have documented all relevant documentation on the behalf of [name], FNP,as directed by  [name], FNP while in the presence of [name], FNP.  Subjective:  Patient ID: Erik West , male    DOB: December 30, 1980 , 42 y.o.   MRN: [id number]  Chief Complaint  Patient presents with   Hypertension    HPI  Patient presents today for a BP follow up. Patient reports compliance with medications. Patient has no concerns today, patient denies any chest pain, SOB, or headaches.  Patient reports his BP has been running high for about 2 months.   Patient reports his neck has been hurting ever since his neck surgery, patient reports he wants FMLA due to neck pain. He had cevical surgery by Dr. Meredith Leeds about 2 years ago. He seen him in January. He works in a [employer] and he has difficulty with his neck and back.   He had his gallbladder removed in May and is now back at work for the second week.   BP Readings from Last 3 Encounters: 10/19/22 : (!) 160/90 08/24/22 : (!) 144/80 08/22/22 : (!) 140/100       Past Medical History:  Diagnosis Date   Essential hypertension    Hypersomnia    Sleep apnea    diagnosed "maybe a year ago" uses CPAP     Family History  Problem Relation Age of Onset   Hypertension Paternal Grandmother    Cancer Other      Current Outpatient Medications:    albuterol (VENTOLIN HFA) 108 (90 Base) MCG/ACT inhaler, Inhale 2 puffs into the lungs every 6 (six) hours as needed for wheezing or shortness of breath., Disp: 8 g, Rfl: 2   cetirizine (ZYRTEC) 10 MG tablet, TAKE 1 TABLET BY MOUTH EVERY DAY (Patient taking differently: Take 10 mg by mouth daily as needed for allergies or rhinitis.), Disp: 90 tablet, Rfl: 0   fluticasone (FLONASE) 50 MCG/ACT nasal spray, Place 1 spray into both nostrils 2 (two) times daily as needed for rhinitis or allergies., Disp: , Rfl:    methocarbamol  (ROBAXIN) 500 MG tablet, Take 500 mg by mouth every 6 (six) hours as needed for muscle spasms (or tension)., Disp: , Rfl:    multivitamin (ONE-A-DAY MEN'S) TABS tablet, Take 1 tablet by mouth daily with breakfast., Disp: , Rfl:    TYLENOL 500 MG tablet, Take 500-1,000 mg by mouth every 6 (six) hours as needed for mild pain or headache., Disp: , Rfl:    amLODipine (NORVASC) 2.5 MG tablet, Take 1 tablet (2.5 mg total) by mouth daily., Disp: 90 tablet, Rfl: 1   lisinopril (ZESTRIL) 10 MG tablet, Take 1 tablet (10 mg total) by mouth daily., Disp: 90 tablet, Rfl: 1   No Known Allergies   Review of Systems  Constitutional: Negative.   HENT: Negative.    Gastrointestinal: Negative.   Genitourinary: Negative.   Musculoskeletal:  Positive for neck pain.  Skin: Negative.   Allergic/Immunologic: Negative.   Neurological: Negative.   Psychiatric/Behavioral: Negative.    All other systems reviewed and are negative.    Today's Vitals   10/19/22 1551  BP: (!) 160/90  Pulse: 86  Temp: 98.1 F (36.7 C)  TempSrc: Oral  Weight: 233 lb 12.8 oz (106.1 kg)  Height: 5\' 11"  (1.803 m)  PainSc: 5   PainLoc: Neck   Body mass index is 32.61 kg/m.  Wt Readings from Last 3  Encounters:  11/02/22 233 lb (105.7 kg)  10/19/22 233 lb 12.8 oz (106.1 kg)  08/23/22 225 lb (102.1 kg)      Objective:  Physical Exam Vitals reviewed.  Constitutional:      General: He is not in acute distress.    Appearance: Normal appearance. He is obese.  Cardiovascular:     Rate and Rhythm: Normal rate and regular rhythm.     Pulses: Normal pulses.     Heart sounds: Normal heart sounds. No murmur heard. Pulmonary:     Effort: Pulmonary effort is normal. No respiratory distress.     Breath sounds: Normal breath sounds. No wheezing.  Musculoskeletal:        General: Normal range of motion.  Skin:    General: Skin is warm and dry.     Capillary Refill: Capillary refill takes less than 2 seconds.  Neurological:      General: No focal deficit present.     Mental Status: He is alert and oriented to person, place, and time.     Cranial Nerves: No cranial nerve deficit.     Motor: No weakness.         Assessment And Plan:  Class 1 obesity due to excess calories with body mass index (BMI) of 32.0 to 32.9 in adult, unspecified whether serious comorbidity present Assessment & Plan: he is encouraged to strive for BMI less than 30 to decrease cardiac risk. Advised to aim for at least 150 minutes of exercise per week.    Uncontrolled hypertension Assessment & Plan: Blood pressure has been elevated, will add amlodpine 2.5 mg daily discussed side effects to include swelling to lower extremities. Encouraged to eat a low salt diet. RTO in 2 weeks for BP check  Orders: -     amLODIPine Besylate; Take 1 tablet (2.5 mg total) by mouth daily.  Dispense: 90 tablet; Refill: 1 -     Lisinopril; Take 1 tablet (10 mg total) by mouth daily.  Dispense: 90 tablet; Refill: 1  COVID-19 vaccination declined Assessment & Plan: Declines covid 19 vaccine. Discussed risk of covid 52 and if he changes her mind about the vaccine to call the office. Education has been provided regarding the importance of this vaccine but patient still declined. Advised may receive this vaccine at local pharmacy or Health Dept.or vaccine clinic. Aware to provide a copy of the vaccination record if obtained from local pharmacy or Health Dept.  Encouraged to take multivitamin, vitamin d, vitamin c and zinc to increase immune system. Aware can call office if would like to have vaccine here at office. Verbalized acceptance and understanding.      Return for uncontrolled bp 3 months check; 2 week [place] BP check.  Patient was given opportunity to ask questions. Patient verbalized understanding of the plan and was able to repeat key elements of the plan. All questions were answered to their satisfaction.    Jeanell Sparrow, FNP, have reviewed all  documentation for this visit. The documentation on 10/19/22 for the exam, diagnosis, procedures, and orders are all accurate and complete.   IF YOU HAVE BEEN REFERRED TO A SPECIALIST, IT MAY TAKE 1-2 WEEKS TO SCHEDULE/PROCESS THE REFERRAL. IF YOU HAVE NOT HEARD FROM US/SPECIALIST IN TWO WEEKS, PLEASE GIVE [place] A CALL AT [phone].

## 2022-11-02 ENCOUNTER — Ambulatory Visit: Payer: Managed Care, Other (non HMO)

## 2022-11-02 VITALS — BP 126/84 | HR 76 | Temp 98.1°F | Ht 71.0 in | Wt 233.0 lb

## 2022-11-02 DIAGNOSIS — I10 Essential (primary) hypertension: Secondary | ICD-10-CM

## 2022-11-02 NOTE — Progress Notes (Signed)
Patient presents today for bpc. He currently takes Amlodipine 2.5MG  at night & Lisinopril 10MG  in the morning. Denies headache, chest pain, SOB. He reports drink 3-4 16oz bottles of water a day. BP Readings from Last 3 Encounters:  11/02/22 126/84  10/19/22 (!) 160/90  08/24/22 (!) 144/80  Per provider patient is to continue current dose and make sure he is eating a low salt diet. Future appointment scheduled. Patient aware.

## 2022-11-05 DIAGNOSIS — I10 Essential (primary) hypertension: Secondary | ICD-10-CM

## 2022-11-05 DIAGNOSIS — E6609 Other obesity due to excess calories: Secondary | ICD-10-CM | POA: Insufficient documentation

## 2022-11-05 DIAGNOSIS — Z2821 Immunization not carried out because of patient refusal: Secondary | ICD-10-CM | POA: Insufficient documentation

## 2022-11-05 HISTORY — DX: Essential (primary) hypertension: I10

## 2022-11-05 NOTE — Assessment & Plan Note (Signed)

## 2022-11-05 NOTE — Assessment & Plan Note (Addendum)
Blood pressure has been elevated, will add amlodpine 2.5 mg daily discussed side effects to include swelling to lower extremities. Encouraged to eat a low salt diet. RTO in 2 weeks for BP check

## 2022-11-05 NOTE — Assessment & Plan Note (Signed)
he is encouraged to strive for BMI less than 30 to decrease cardiac risk. Advised to aim for at least 150 minutes of exercise per week.

## 2023-01-04 ENCOUNTER — Encounter: Payer: BC Managed Care – PPO | Admitting: Nurse Practitioner

## 2023-01-16 ENCOUNTER — Encounter: Payer: Self-pay | Admitting: Nurse Practitioner

## 2023-01-16 ENCOUNTER — Ambulatory Visit (INDEPENDENT_AMBULATORY_CARE_PROVIDER_SITE_OTHER): Payer: Managed Care, Other (non HMO) | Admitting: Nurse Practitioner

## 2023-01-16 VITALS — BP 140/102 | HR 94 | Temp 98.4°F | Ht 71.0 in | Wt 234.4 lb

## 2023-01-16 DIAGNOSIS — Z125 Encounter for screening for malignant neoplasm of prostate: Secondary | ICD-10-CM

## 2023-01-16 DIAGNOSIS — Z0000 Encounter for general adult medical examination without abnormal findings: Secondary | ICD-10-CM | POA: Diagnosis not present

## 2023-01-16 DIAGNOSIS — E7800 Pure hypercholesterolemia, unspecified: Secondary | ICD-10-CM

## 2023-01-16 DIAGNOSIS — Z79899 Other long term (current) drug therapy: Secondary | ICD-10-CM

## 2023-01-16 DIAGNOSIS — G4733 Obstructive sleep apnea (adult) (pediatric): Secondary | ICD-10-CM

## 2023-01-16 DIAGNOSIS — I10 Essential (primary) hypertension: Secondary | ICD-10-CM

## 2023-01-16 DIAGNOSIS — Z9989 Dependence on other enabling machines and devices: Secondary | ICD-10-CM | POA: Insufficient documentation

## 2023-01-16 DIAGNOSIS — Z2821 Immunization not carried out because of patient refusal: Secondary | ICD-10-CM

## 2023-01-16 DIAGNOSIS — R238 Other skin changes: Secondary | ICD-10-CM | POA: Diagnosis not present

## 2023-01-16 DIAGNOSIS — Z13228 Encounter for screening for other metabolic disorders: Secondary | ICD-10-CM

## 2023-01-16 MED ORDER — AMLODIPINE BESYLATE 5 MG PO TABS
5.0000 mg | ORAL_TABLET | Freq: Every day | ORAL | 1 refills | Status: DC
Start: 2023-01-16 — End: 2023-08-16

## 2023-01-16 NOTE — Patient Instructions (Addendum)
DR Nita Sells DERMATOLOGY [phone]   Health Maintenance  Topic Date Due   COVID-19 Vaccine (3 - Pfizer risk series) 02/01/2023*   Flu Shot  07/09/2023*   DTaP/Tdap/Td vaccine (2 - Td or Tdap) 06/11/2029   Hepatitis C Screening  Completed   HIV Screening  Completed   HPV Vaccine  Aged Out  *Topic was postponed. The date shown is not the original due date.   I encourage you to eat more vegetables at least daily.

## 2023-01-16 NOTE — Assessment & Plan Note (Signed)
Cholesterol levels have been stable, continue to limit intake of fried and fatty foods.

## 2023-01-16 NOTE — Progress Notes (Signed)
[name], CMA,acting as a Neurosurgeon for [name], FNP.,have documented all relevant documentation on the behalf of [name], FNP,as directed by  [name], FNP while in the presence of [name], FNP.  Subjective:   Patient ID: Erik West , male    DOB: 08-14-1980 , 42 y.o.   MRN: [id number]  Chief Complaint  Patient presents with   Annual Exam    HPI  Patient presents today for HM, Patient reports compliance with medication. Patient denies any chest pain, SOB, or headaches. Patient reports his elbow is hurting from a fall 5 years ago. He reports his scalp is also flaky and itchy.   He has not used the CPAP in a year, he had been feeling better.   BP Readings from Last 3 Encounters: 01/16/23 : (!) 140/100 11/02/22 : 126/84 10/19/22 : (!) 160/90       Past Medical History:  Diagnosis Date   Essential hypertension    Hypersomnia    Sleep apnea    diagnosed "maybe a year ago" uses CPAP     Family History  Problem Relation Age of Onset   Hypertension Paternal Grandmother    Cancer Other      Current Outpatient Medications:    albuterol (VENTOLIN HFA) 108 (90 Base) MCG/ACT inhaler, Inhale 2 puffs into the lungs every 6 (six) hours as needed for wheezing or shortness of breath., Disp: 8 g, Rfl: 2   cetirizine (ZYRTEC) 10 MG tablet, TAKE 1 TABLET BY MOUTH EVERY DAY (Patient taking differently: Take 10 mg by mouth daily as needed for allergies or rhinitis.), Disp: 90 tablet, Rfl: 0   fluticasone (FLONASE) 50 MCG/ACT nasal spray, Place 1 spray into both nostrils 2 (two) times daily as needed for rhinitis or allergies., Disp: , Rfl:    lisinopril (ZESTRIL) 10 MG tablet, Take 1 tablet (10 mg total) by mouth daily., Disp: 90 tablet, Rfl: 1   methocarbamol (ROBAXIN) 500 MG tablet, Take 500 mg by mouth every 6 (six) hours as needed for muscle spasms (or tension)., Disp: , Rfl:    multivitamin (ONE-A-DAY MEN'S) TABS tablet, Take 1 tablet by mouth daily with  breakfast., Disp: , Rfl:    TYLENOL 500 MG tablet, Take 500-1,000 mg by mouth every 6 (six) hours as needed for mild pain or headache., Disp: , Rfl:    amLODipine (NORVASC) 5 MG tablet, Take 1 tablet (5 mg total) by mouth daily., Disp: 90 tablet, Rfl: 1   No Known Allergies   Men's preventive visit. Patient Health Questionnaire (PHQ-2) is  Flowsheet Row Office Visit from 01/16/2023 in [facility]  PHQ-2 Total Score 3     Patient is on a carnovore diet; chicken, steak and a cheat meal with vegetables. Exercising - he is not exercising - has not had time. Marital status: Married. Relevant history for alcohol use is:  Social History   Substance and Sexual Activity  Alcohol Use Yes   Comment: occ   Relevant history for tobacco use is:  Social History   Tobacco Use  Smoking Status Never  Smokeless Tobacco Never  .   Review of Systems  Constitutional: Negative.   HENT: Negative.    Eyes: Negative.   Respiratory: Negative.  Negative for shortness of breath.   Cardiovascular: Negative.  Negative for chest pain, palpitations and leg swelling.  Gastrointestinal: Negative.   Endocrine: Negative.   Genitourinary: Negative.   Musculoskeletal: Negative.   Skin:  Positive for  rash.       Flaking scalp after washing hair. Had been wearing dreads for several years.  He has used selsun blue and t-gel.   Allergic/Immunologic: Negative.   Neurological: Negative.  Negative for headaches.  Hematological: Negative.   Psychiatric/Behavioral: Negative.       Today's Vitals   01/16/23 1545 01/16/23 1617  BP: (!) 140/100 (!) 140/102  Pulse: 94   Temp: 98.4 F (36.9 C)   TempSrc: Oral   Weight: 234 lb 6.4 oz (106.3 kg)   Height: 5\' 11"  (1.803 m)   PainSc: 5    PainLoc: Back    Body mass index is 32.69 kg/m.  Wt Readings from Last 3 Encounters:  01/16/23 234 lb 6.4 oz (106.3 kg)  11/02/22 233 lb (105.7 kg)  10/19/22 233 lb 12.8 oz (106.1 kg)     Objective:  Physical Exam Vitals reviewed.  Constitutional:      General: He is not in acute distress.    Appearance: Normal appearance. He is obese.  HENT:     Head: Normocephalic and atraumatic.     Right Ear: Tympanic membrane, ear canal and external ear normal. There is no impacted cerumen.     Left Ear: Tympanic membrane, ear canal and external ear normal. There is no impacted cerumen.     Nose: Nose normal.     Mouth/Throat:     Mouth: Mucous membranes are moist.  Eyes:     Extraocular Movements: Extraocular movements intact.     Conjunctiva/sclera: Conjunctivae normal.     Pupils: Pupils are equal, round, and reactive to light.  Cardiovascular:     Rate and Rhythm: Normal rate and regular rhythm.     Pulses: Normal pulses.     Heart sounds: Normal heart sounds. No murmur heard. Pulmonary:     Effort: Pulmonary effort is normal. No respiratory distress.     Breath sounds: Normal breath sounds. No wheezing.  Abdominal:     General: Abdomen is flat. Bowel sounds are normal. There is no distension.     Palpations: Abdomen is soft.     Tenderness: There is no abdominal tenderness. There is no guarding.  Genitourinary:    Prostate: Normal.     Rectum: Guaiac result negative.  Musculoskeletal:        General: No swelling or tenderness. Normal range of motion.     Cervical back: Normal range of motion and neck supple.  Skin:    General: Skin is warm and dry.     Capillary Refill: Capillary refill takes less than 2 seconds.     Comments: Few keloid type scars to abdomen, scaly skin to left elbow  Neurological:     General: No focal deficit present.     Mental Status: He is alert and oriented to person, place, and time.     Cranial Nerves: No cranial nerve deficit.     Motor: No weakness.  Psychiatric:        Mood and Affect: Mood normal.        Behavior: Behavior normal.        Thought Content: Thought content normal.        Judgment: Judgment normal.          Assessment And Plan:    Encounter for annual health examination Assessment & Plan: Behavior modifications discussed and diet history reviewed.   Pt will continue to exercise regularly and modify diet with low GI, plant based foods and decrease intake of  processed foods.  Recommend intake of daily multivitamin, Vitamin D, and calcium.  Recommend mammogram for preventive screenings, as well as recommend immunizations that include influenza, TDAP    Uncontrolled hypertension Assessment & Plan: Blood pressure is elevated, will increase amlodipine to 5 mg, return in 2 weeks for NV. EKG done with SR Diffuse nonspecific T-abnormality   Orders: -     EKG 12-Lead -     Microalbumin / creatinine urine ratio -     POCT URINALYSIS DIP (CLINITEK) -     amLODIPine Besylate; Take 1 tablet (5 mg total) by mouth daily.  Dispense: 90 tablet; Refill: 1  Elevated cholesterol Assessment & Plan: Cholesterol levels have been stable, continue to limit intake of fried and fatty foods.   Orders: -     Lipid panel  Obstructive sleep apnea with dependence on continuous positive airway pressure (CPAP) Assessment & Plan: I have advised him to use his CPAP, he was likely feeling better due to wearing his CPAP. Explained this can cause his bp to increase if now controlled.    Influenza vaccination declined  COVID-19 vaccination declined  Scalp irritation Assessment & Plan: Will refer again to Dermatology - will try for Dr. Nita Sells  Orders: -     Ambulatory referral to Dermatology  Encounter for screening for metabolic disorder -     CMP14+EGFR -     Hemoglobin A1c  Other long term (current) drug therapy -     CBC with Differential/Platelet  Encounter for prostate cancer screening -     PSA   Return for 1 year physical, uncontrolled bp 3-18months check. Patient was given opportunity to ask questions. Patient verbalized understanding of the plan and was able to repeat key elements of the  plan. All questions were answered to their satisfaction.   [name], FNP  I, [name], FNP, have reviewed all documentation for this visit. The documentation on 01/16/23 for the exam, diagnosis, procedures, and orders are all accurate and complete.

## 2023-01-16 NOTE — Assessment & Plan Note (Signed)
I have advised him to use his CPAP, he was likely feeling better due to wearing his CPAP. Explained this can cause his bp to increase if now controlled.

## 2023-01-16 NOTE — Assessment & Plan Note (Signed)
Will refer again to Dermatology - will try for Dr. Nita Sells

## 2023-01-16 NOTE — Assessment & Plan Note (Signed)
Blood pressure is elevated, will increase amlodipine to 5 mg, return in 2 weeks for NV. EKG done with SR Diffuse nonspecific T-abnormality

## 2023-01-16 NOTE — Assessment & Plan Note (Signed)
 Behavior modifications discussed and diet history reviewed.   Pt will continue to exercise regularly and modify diet with low GI, plant based foods and decrease intake of processed foods.  Recommend intake of daily multivitamin, Vitamin D, and calcium.  Recommend mammogram for preventive screenings, as well as recommend immunizations that include influenza, TDAP

## 2023-01-17 LAB — CMP14+EGFR
ALT: 25 [IU]/L (ref 0–44)
AST: 24 [IU]/L (ref 0–40)
Albumin: 4.4 g/dL (ref 4.1–5.1)
Alkaline Phosphatase: 100 [IU]/L (ref 44–121)
BUN/Creatinine Ratio: 17 (ref 9–20)
BUN: 24 mg/dL (ref 6–24)
Bilirubin Total: 0.4 mg/dL (ref 0.0–1.2)
CO2: 21 mmol/L (ref 20–29)
Calcium: 10.1 mg/dL (ref 8.7–10.2)
Chloride: 99 mmol/L (ref 96–106)
Creatinine, Ser: 1.43 mg/dL — ABNORMAL HIGH (ref 0.76–1.27)
Globulin, Total: 3.2 g/dL (ref 1.5–4.5)
Glucose: 94 mg/dL (ref 70–99)
Potassium: 4 mmol/L (ref 3.5–5.2)
Sodium: 138 mmol/L (ref 134–144)
Total Protein: 7.6 g/dL (ref 6.0–8.5)
eGFR: 63 mL/min/{1.73_m2} (ref >59)

## 2023-01-17 LAB — CBC WITH DIFFERENTIAL/PLATELET
Basophils Absolute: 0.1 10*3/uL (ref 0.0–0.2)
Basos: 1 %
EOS (ABSOLUTE): 0.2 10*3/uL (ref 0.0–0.4)
Eos: 3 %
Hematocrit: 47.6 % (ref 37.5–51.0)
Hemoglobin: 15.3 g/dL (ref 13.0–17.7)
Immature Grans (Abs): 0 10*3/uL (ref 0.0–0.1)
Immature Granulocytes: 0 %
Lymphocytes Absolute: 1.9 10*3/uL (ref 0.7–3.1)
Lymphs: 28 %
MCH: 28.1 pg (ref 26.6–33.0)
MCHC: 32.1 g/dL (ref 31.5–35.7)
MCV: 88 fL (ref 79–97)
Monocytes Absolute: 0.7 10*3/uL (ref 0.1–0.9)
Monocytes: 10 %
Neutrophils Absolute: 4.1 10*3/uL (ref 1.4–7.0)
Neutrophils: 58 %
Platelets: 246 10*3/uL (ref 150–450)
RBC: 5.44 x10E6/uL (ref 4.14–5.80)
RDW: 13.9 % (ref 11.6–15.4)
WBC: 7 10*3/uL (ref 3.4–10.8)

## 2023-01-17 LAB — HEMOGLOBIN A1C
Est. average glucose Bld gHb Est-mCnc: 123 mg/dL
Hgb A1c MFr Bld: 5.9 % — ABNORMAL HIGH (ref 4.8–5.6)

## 2023-01-17 LAB — LIPID PANEL
Chol/HDL Ratio: 5.3 {ratio} — ABNORMAL HIGH (ref 0.0–5.0)
Cholesterol, Total: 213 mg/dL — ABNORMAL HIGH (ref 100–199)
HDL: 40 mg/dL (ref >39)
LDL Chol Calc (NIH): 152 mg/dL — ABNORMAL HIGH (ref 0–99)
Triglycerides: 115 mg/dL (ref 0–149)
VLDL Cholesterol Cal: 21 mg/dL (ref 5–40)

## 2023-01-17 LAB — PSA: Prostate Specific Ag, Serum: 1.1 ng/mL (ref 0.0–4.0)

## 2023-01-22 ENCOUNTER — Ambulatory Visit: Payer: Self-pay | Admitting: Nurse Practitioner

## 2023-01-30 ENCOUNTER — Ambulatory Visit: Payer: Managed Care, Other (non HMO)

## 2023-01-30 VITALS — BP 118/90 | HR 95 | Temp 98.1°F | Ht 71.0 in | Wt 234.0 lb

## 2023-01-30 DIAGNOSIS — I10 Essential (primary) hypertension: Secondary | ICD-10-CM

## 2023-01-30 MED ORDER — VALSARTAN 160 MG PO TABS: 160.0000 mg | ORAL_TABLET | Freq: Every day | ORAL | 1 refills | Status: DC

## 2023-01-30 NOTE — Progress Notes (Signed)
Patient here for bpc. He currently takes Amlodipine 10MG  he reports taking at night & Lisinopril 10MG  he reports taking in the morning. Denies headache, chest pain & sob. He admits coming from work 30-45 minutes ago.  BP Readings from Last 3 Encounters:  01/30/23 (!) 138/110  01/16/23 (!) 140/102  11/02/22 126/84  Bp rechecked after 10 minutes: 118/90. Per provider start valsartan 160 mg and stop the lisinopril and have him to return in 2 weeks. Patient aware. Appointment scheduled. Medication sent.

## 2023-01-30 NOTE — Patient Instructions (Signed)
Hypertension, Adult Hypertension is another name for high blood pressure. High blood pressure forces your heart to work harder to pump blood. This can cause problems over time. There are two numbers in a blood pressure reading. There is a top number (systolic) over a bottom number (diastolic). It is best to have a blood pressure that is below 120/80. What are the causes? The cause of this condition is not known. Some other conditions can lead to high blood pressure. What increases the risk? Some lifestyle factors can make you more likely to develop high blood pressure: Smoking. Not getting enough exercise or physical activity. Being overweight. Having too much fat, sugar, calories, or salt (sodium) in your diet. Drinking too much alcohol. Other risk factors include: Having any of these conditions: Heart disease. Diabetes. High cholesterol. Kidney disease. Obstructive sleep apnea. Having a family history of high blood pressure and high cholesterol. Age. The risk increases with age. Stress. What are the signs or symptoms? High blood pressure may not cause symptoms. Very high blood pressure (hypertensive crisis) may cause: Headache. Fast or uneven heartbeats (palpitations). Shortness of breath. Nosebleed. Vomiting or feeling like you may vomit (nauseous). Changes in how you see. Very bad chest pain. Feeling dizzy. Seizures. How is this treated? This condition is treated by making healthy lifestyle changes, such as: Eating healthy foods. Exercising more. Drinking less alcohol. Your doctor may prescribe medicine if lifestyle changes do not help enough and if: Your top number is above 130. Your bottom number is above 80. Your personal target blood pressure may vary. Follow these instructions at home: Eating and drinking  If told, follow the DASH eating plan. To follow this plan: Fill one half of your plate at each meal with fruits and vegetables. Fill one fourth of your plate  at each meal with whole grains. Whole grains include whole-wheat pasta, brown rice, and whole-grain bread. Eat or drink low-fat dairy products, such as skim milk or low-fat yogurt. Fill one fourth of your plate at each meal with low-fat (lean) proteins. Low-fat proteins include fish, chicken without skin, eggs, beans, and tofu. Avoid fatty meat, cured and processed meat, or chicken with skin. Avoid pre-made or processed food. Limit the amount of salt in your diet to less than 1,500 mg each day. Do not drink alcohol if: Your doctor tells you not to drink. You are pregnant, may be pregnant, or are planning to become pregnant. If you drink alcohol: Limit how much you have to: 0-1 drink a day for women. 0-2 drinks a day for men. Know how much alcohol is in your drink. In the [place] one drink equals one 12 oz bottle of beer (355 mL), one 5 oz glass of wine (148 mL), or one 1 oz glass of hard liquor (44 mL). Lifestyle  Work with your doctor to stay at a healthy weight or to lose weight. Ask your doctor what the best weight is for you. Get at least 30 minutes of exercise that causes your heart to beat faster (aerobic exercise) most days of the week. This may include walking, swimming, or biking. Get at least 30 minutes of exercise that strengthens your muscles (resistance exercise) at least 3 days a week. This may include lifting weights or doing Pilates. Do not smoke or use any products that contain nicotine or tobacco. If you need help quitting, ask your doctor. Check your blood pressure at home as told by your doctor. Keep all follow-up visits. Medicines Take over-the-counter and prescription medicines   only as told by your doctor. Follow directions carefully. Do not skip doses of blood pressure medicine. The medicine does not work as well if you skip doses. Skipping doses also puts you at risk for problems. Ask your doctor about side effects or reactions to medicines that you should watch  for. Contact a doctor if: You think you are having a reaction to the medicine you are taking. You have headaches that keep coming back. You feel dizzy. You have swelling in your ankles. You have trouble with your vision. Get help right away if: You get a very bad headache. You start to feel mixed up (confused). You feel weak or numb. You feel faint. You have very bad pain in your: Chest. Belly (abdomen). You vomit more than once. You have trouble breathing. These symptoms may be an emergency. Get help right away. Call 911. Do not wait to see if the symptoms will go away. Do not drive yourself to the hospital. Summary Hypertension is another name for high blood pressure. High blood pressure forces your heart to work harder to pump blood. For most people, a normal blood pressure is less than 120/80. Making healthy choices can help lower blood pressure. If your blood pressure does not get lower with healthy choices, you may need to take medicine. This information is not intended to replace advice given to you by your health care provider. Make sure you discuss any questions you have with your health care provider. Document Revised: 01/13/2021 Document Reviewed: 01/13/2021 Elsevier Patient Education  2024 [employer]  

## 2023-02-13 ENCOUNTER — Ambulatory Visit: Payer: Managed Care, Other (non HMO)

## 2023-02-13 VITALS — BP 136/80 | HR 98 | Temp 98.6°F | Ht 71.0 in | Wt 234.0 lb

## 2023-02-13 DIAGNOSIS — I10 Essential (primary) hypertension: Secondary | ICD-10-CM

## 2023-02-13 NOTE — Progress Notes (Signed)
Patient presents today for a BP check, patient currently taking amLODipine 5mg  PM,  Valsartan 160mg  AM. BP Readings from Last 3 Encounters:  02/13/23 (!) 140/76  01/30/23 (!) 118/90  01/16/23 (!) 140/102   Per provider - continue with same medications- f/u next month.

## 2023-02-22 ENCOUNTER — Other Ambulatory Visit: Payer: Self-pay | Admitting: Nurse Practitioner

## 2023-03-13 ENCOUNTER — Ambulatory Visit: Payer: Managed Care, Other (non HMO)

## 2023-04-19 ENCOUNTER — Ambulatory Visit: Payer: Self-pay | Admitting: Nurse Practitioner

## 2023-04-19 ENCOUNTER — Encounter: Payer: Self-pay | Admitting: Nurse Practitioner

## 2023-04-19 NOTE — Progress Notes (Deleted)
 LILLETTE Kristeen JINNY Gladis, CMA,acting as a neurosurgeon for Erik Ada, FNP.,have documented all relevant documentation on the behalf of Erik Ada, FNP,as directed by  Erik Ada, FNP while in the presence of Erik Ada, FNP.  Subjective:  Patient ID: Erik West , male    DOB: 07-29-1980 , 43 y.o.   MRN: [id number]  No chief complaint on file.   HPI  Patient presents today for a bp follow up, Patient reports compliance with medication. Patient denies any chest pain, SOB, or headaches. Patient has no concerns today.     Past Medical History:  Diagnosis Date  . Essential hypertension   . Hypersomnia   . Sleep apnea    diagnosed maybe a year ago uses CPAP     Family History  Problem Relation Age of Onset  . Hypertension Paternal Grandmother   . Cancer Other      Current Outpatient Medications:  .  albuterol  (VENTOLIN  HFA) 108 (90 Base) MCG/ACT inhaler, Inhale 2 puffs into the lungs every 6 (six) hours as needed for wheezing or shortness of breath., Disp: 8 g, Rfl: 2 .  amLODipine  (NORVASC ) 5 MG tablet, Take 1 tablet (5 mg total) by mouth daily., Disp: 90 tablet, Rfl: 1 .  cetirizine  (ZYRTEC ) 10 MG tablet, TAKE 1 TABLET BY MOUTH EVERY DAY (Patient taking differently: Take 10 mg by mouth daily as needed for allergies or rhinitis.), Disp: 90 tablet, Rfl: 0 .  fluticasone  (FLONASE ) 50 MCG/ACT nasal spray, Place 1 spray into both nostrils 2 (two) times daily as needed for rhinitis or allergies., Disp: , Rfl:  .  lisinopril  (ZESTRIL ) 10 MG tablet, Take 1 tablet (10 mg total) by mouth daily. (Patient not taking: Reported on 02/13/2023), Disp: 90 tablet, Rfl: 1 .  methocarbamol  (ROBAXIN ) 500 MG tablet, Take 500 mg by mouth every 6 (six) hours as needed for muscle spasms (or tension)., Disp: , Rfl:  .  multivitamin (ONE-A-DAY MEN'S) TABS tablet, Take 1 tablet by mouth daily with breakfast., Disp: , Rfl:  .  TYLENOL  500 MG tablet, Take 500-1,000 mg by mouth every 6 (six) hours as needed for  mild pain or headache., Disp: , Rfl:  .  valsartan  (DIOVAN ) 160 MG tablet, TAKE 1 TABLET BY MOUTH EVERY DAY, Disp: 90 tablet, Rfl: 1   No Known Allergies   Review of Systems  Constitutional: Negative.   HENT: Negative.    Eyes: Negative.   Respiratory: Negative.    Cardiovascular: Negative.   Gastrointestinal: Negative.     There were no vitals filed for this visit. There is no height or weight on file to calculate BMI.  Wt Readings from Last 3 Encounters:  02/13/23 234 lb (106.1 kg)  01/30/23 234 lb (106.1 kg)  01/16/23 234 lb 6.4 oz (106.3 kg)    The 10-year ASCVD risk score (Arnett DK, et al., 2019) is: 7.1%   Values used to calculate the score:     Age: 48 years     Sex: Male     Is Non-Hispanic African American: Yes     Diabetic: No     Tobacco smoker: No     Systolic Blood Pressure: 136 mmHg     Is BP treated: Yes     HDL Cholesterol: 40 mg/dL     Total Cholesterol: 213 mg/dL  Objective:  Physical Exam      Assessment And Plan:  Uncontrolled hypertension    No follow-ups on file.  Patient was given opportunity to ask questions.  Patient verbalized understanding of the plan and was able to repeat key elements of the plan. All questions were answered to their satisfaction.    LILLETTE Erik Ada, FNP, have reviewed all documentation for this visit. The documentation on 04/19/23 for the exam, diagnosis, procedures, and orders are all accurate and complete.   IF YOU HAVE BEEN REFERRED TO A SPECIALIST, IT MAY TAKE 1-2 WEEKS TO SCHEDULE/PROCESS THE REFERRAL. IF YOU HAVE NOT HEARD FROM US /SPECIALIST IN TWO WEEKS, PLEASE GIVE US  A CALL AT [phone].

## 2023-05-03 ENCOUNTER — Ambulatory Visit (INDEPENDENT_AMBULATORY_CARE_PROVIDER_SITE_OTHER): Payer: Managed Care, Other (non HMO) | Admitting: Nurse Practitioner

## 2023-05-03 ENCOUNTER — Encounter: Payer: Self-pay | Admitting: Nurse Practitioner

## 2023-05-03 VITALS — BP 128/86 | HR 98 | Temp 98.5°F | Ht 71.0 in | Wt 249.2 lb

## 2023-05-03 DIAGNOSIS — Z6834 Body mass index (BMI) 34.0-34.9, adult: Secondary | ICD-10-CM

## 2023-05-03 DIAGNOSIS — E66811 Obesity, class 1: Secondary | ICD-10-CM

## 2023-05-03 DIAGNOSIS — Z2821 Immunization not carried out because of patient refusal: Secondary | ICD-10-CM

## 2023-05-03 DIAGNOSIS — R238 Other skin changes: Secondary | ICD-10-CM

## 2023-05-03 DIAGNOSIS — I10 Essential (primary) hypertension: Secondary | ICD-10-CM

## 2023-05-03 DIAGNOSIS — R7309 Other abnormal glucose: Secondary | ICD-10-CM | POA: Diagnosis not present

## 2023-05-03 DIAGNOSIS — E7800 Pure hypercholesterolemia, unspecified: Secondary | ICD-10-CM

## 2023-05-03 DIAGNOSIS — E6609 Other obesity due to excess calories: Secondary | ICD-10-CM

## 2023-05-03 NOTE — Assessment & Plan Note (Signed)
he is encouraged to strive for BMI less than 30 to decrease cardiac risk. Advised to aim for at least 150 minutes of exercise per week.

## 2023-05-03 NOTE — Progress Notes (Signed)
[name], CMA,acting as a Neurosurgeon for [name], FNP.,have documented all relevant documentation on the behalf of [name], FNP,as directed by  [name], FNP while in the presence of [name], FNP.  Subjective:  Patient ID: Erik West , male    DOB: [birth year] , 43 y.o.   MRN: [id number]  Chief Complaint  Patient presents with   Hypertension    HPI  Patient presents today for a bp follow up, Patient reports compliance with medication. Patient denies any chest pain, SOB, or headaches. Patient has no concerns today. Patient reports he is taking prednisone for his neck; he seen the neurosurgeon. 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   He has seen Dermatology for the irritation to his scalp and given some different shampoos, they have noticed a difference. They have tried to eliminate certain hair products.   Hypertension This is a chronic problem. The current episode started more than 1 year ago. The problem has been gradually improving since onset. The problem is uncontrolled. Pertinent negatives include no anxiety, chest pain, headaches, palpitations or shortness of breath. There are no associated agents to hypertension. Risk factors for coronary artery disease include male gender. Past treatments include calcium channel blockers, angiotensin blockers and diuretics. There are no compliance problems.  There is no history of angina. There is no history of chronic renal disease.     Past Medical History:  Diagnosis Date   Essential hypertension    Hypersomnia    Sleep apnea    diagnosed "maybe a year ago" uses CPAP   Uncontrolled hypertension 11/05/2022     Family History  Problem Relation Age of Onset   Hypertension Paternal Grandmother    Cancer Other      Current Outpatient Medications:    albuterol (VENTOLIN HFA) 108 (90 Base) MCG/ACT inhaler, Inhale 2 puffs into the lungs every 6 (six) hours as needed for wheezing or shortness  of breath., Disp: 8 g, Rfl: 2   amLODipine (NORVASC) 5 MG tablet, Take 1 tablet (5 mg total) by mouth daily., Disp: 90 tablet, Rfl: 1   cetirizine (ZYRTEC) 10 MG tablet, TAKE 1 TABLET BY MOUTH EVERY DAY (Patient taking differently: Take 10 mg by mouth daily as needed for allergies or rhinitis.), Disp: 90 tablet, Rfl: 0   fluticasone (FLONASE) 50 MCG/ACT nasal spray, Place 1 spray into both nostrils 2 (two) times daily as needed for rhinitis or allergies., Disp: , Rfl:    lisinopril (ZESTRIL) 10 MG tablet, Take 1 tablet (10 mg total) by mouth daily., Disp: 90 tablet, Rfl: 1   methocarbamol (ROBAXIN) 500 MG tablet, Take 500 mg by mouth every 6 (six) hours as needed for muscle spasms (or tension)., Disp: , Rfl:    multivitamin (ONE-A-DAY MEN'S) TABS tablet, Take 1 tablet by mouth daily with breakfast., Disp: , Rfl:    TYLENOL 500 MG tablet, Take 500-1,000 mg by mouth every 6 (six) hours as needed for mild pain or headache., Disp: , Rfl:    valsartan (DIOVAN) 160 MG tablet, TAKE 1 TABLET BY MOUTH EVERY DAY, Disp: 90 tablet, Rfl: 1   No Known Allergies   Review of Systems  Constitutional: Negative.   HENT: Negative.    Eyes: Negative.   Respiratory: Negative.  Negative for shortness of breath and wheezing.   Cardiovascular: Negative.  Negative for chest pain and palpitations.  Gastrointestinal: Negative.   Skin:  Scalp irritation  Neurological:  Negative for headaches.  Psychiatric/Behavioral: Negative.       Today's Vitals   05/03/23 1642  BP: 128/86  Pulse: 98  Temp: 98.5 F (36.9 C)  TempSrc: Oral  Weight: 249 lb 3.2 oz (113 kg)  Height: 5\' 11"  (1.803 m)  PainSc: 6   PainLoc: Neck   Body mass index is 34.76 kg/m.  Wt Readings from Last 3 Encounters:  05/03/23 249 lb 3.2 oz (113 kg)  02/13/23 234 lb (106.1 kg)  01/30/23 234 lb (106.1 kg)      Objective:  Physical Exam Vitals reviewed.  Constitutional:      General: He is not in acute distress.    Appearance:  Normal appearance. He is obese.  Cardiovascular:     Rate and Rhythm: Normal rate and regular rhythm.     Pulses: Normal pulses.     Heart sounds: Normal heart sounds. No murmur heard. Pulmonary:     Effort: Pulmonary effort is normal. No respiratory distress.     Breath sounds: Normal breath sounds. No wheezing.  Skin:    General: Skin is warm and dry.     Capillary Refill: Capillary refill takes less than 2 seconds.  Neurological:     General: No focal deficit present.     Mental Status: He is alert and oriented to person, place, and time.     Cranial Nerves: No cranial nerve deficit.  Psychiatric:        Mood and Affect: Mood normal.        Behavior: Behavior normal.        Thought Content: Thought content normal.        Judgment: Judgment normal.         Assessment And Plan:  Elevated cholesterol Assessment & Plan: Cholesterol levels have been stable, continue to limit intake of fried and fatty foods. Will recheck today last time he ate was at 8am  Orders: -     Lipid panel  Scalp irritation Assessment & Plan: Will refer to allergist at patient request  Orders: -     Ambulatory referral to Allergy  Abnormal glucose Assessment & Plan: hgbA1c has been stable, will recheck levels today.   Orders: -     Hemoglobin A1c  COVID-19 vaccination declined Assessment & Plan: Declines covid 19 vaccine. Discussed risk of covid 8 and if he changes her mind about the vaccine to call the office. Education has been provided regarding the importance of this vaccine but patient still declined. Advised may receive this vaccine at local pharmacy or Health Dept.or vaccine clinic. Aware to provide a copy of the vaccination record if obtained from local pharmacy or Health Dept.  Encouraged to take multivitamin, vitamin d, vitamin c and zinc to increase immune system. Aware can call office if would like to have vaccine here at office. Verbalized acceptance and understanding.    Class 1  obesity due to excess calories without serious comorbidity with body mass index (BMI) of 34.0 to 34.9 in adult Assessment & Plan: he is encouraged to strive for BMI less than 30 to decrease cardiac risk. Advised to aim for at least 150 minutes of exercise per week.    Essential hypertension Assessment & Plan: Blood pressure is well controlled today. Continue current medications.      Return for 4 month bp check and phy after 10/8.  Patient was given opportunity to ask questions. Patient verbalized understanding of the plan and was able to  repeat key elements of the plan. All questions were answered to their satisfaction.    Jeanell Sparrow, FNP, have reviewed all documentation for this visit. The documentation on 05/03/23 for the exam, diagnosis, procedures, and orders are all accurate and complete.   IF YOU HAVE BEEN REFERRED TO A SPECIALIST, IT MAY TAKE 1-2 WEEKS TO SCHEDULE/PROCESS THE REFERRAL. IF YOU HAVE NOT HEARD FROM US/SPECIALIST IN TWO WEEKS, PLEASE GIVE [place] A CALL AT [phone].

## 2023-05-03 NOTE — Assessment & Plan Note (Signed)
Cholesterol levels have been stable, continue to limit intake of fried and fatty foods. Will recheck today last time he ate was at 8am

## 2023-05-03 NOTE — Assessment & Plan Note (Signed)

## 2023-05-03 NOTE — Assessment & Plan Note (Signed)
Will refer to allergist at patient request

## 2023-05-03 NOTE — Assessment & Plan Note (Signed)
 Blood pressure is well controlled today. Continue current medications.

## 2023-05-03 NOTE — Assessment & Plan Note (Signed)
hgbA1c has been stable, will recheck levels today.

## 2023-05-04 LAB — LIPID PANEL
Chol/HDL Ratio: 4.4 {ratio} (ref 0.0–5.0)
Cholesterol, Total: 210 mg/dL — ABNORMAL HIGH (ref 100–199)
HDL: 48 mg/dL (ref >39)
LDL Chol Calc (NIH): 147 mg/dL — ABNORMAL HIGH (ref 0–99)
Triglycerides: 84 mg/dL (ref 0–149)
VLDL Cholesterol Cal: 15 mg/dL (ref 5–40)

## 2023-05-04 LAB — HEMOGLOBIN A1C
Est. average glucose Bld gHb Est-mCnc: 123 mg/dL
Hgb A1c MFr Bld: 5.9 % — ABNORMAL HIGH (ref 4.8–5.6)

## 2023-05-17 ENCOUNTER — Ambulatory Visit: Payer: Managed Care, Other (non HMO) | Admitting: Nurse Practitioner

## 2023-05-17 ENCOUNTER — Encounter: Payer: Self-pay | Admitting: Nurse Practitioner

## 2023-05-17 VITALS — BP 130/86 | HR 96 | Temp 98.9°F | Ht 71.0 in | Wt 253.2 lb

## 2023-05-17 DIAGNOSIS — K921 Melena: Secondary | ICD-10-CM | POA: Diagnosis not present

## 2023-05-17 LAB — CBC WITH DIFFERENTIAL/PLATELET
Basophils Absolute: 0 10*3/uL (ref 0.0–0.2)
Basos: 1 %
EOS (ABSOLUTE): 0.2 10*3/uL (ref 0.0–0.4)
Eos: 3 %
Hematocrit: 45.1 % (ref 37.5–51.0)
Hemoglobin: 15 g/dL (ref 13.0–17.7)
Immature Grans (Abs): 0 10*3/uL (ref 0.0–0.1)
Immature Granulocytes: 0 %
Lymphocytes Absolute: 1.9 10*3/uL (ref 0.7–3.1)
Lymphs: 26 %
MCH: 28.7 pg (ref 26.6–33.0)
MCHC: 33.3 g/dL (ref 31.5–35.7)
MCV: 86 fL (ref 79–97)
Monocytes Absolute: 0.8 10*3/uL (ref 0.1–0.9)
Monocytes: 11 %
Neutrophils Absolute: 4.2 10*3/uL (ref 1.4–7.0)
Neutrophils: 59 %
Platelets: 249 10*3/uL (ref 150–450)
RBC: 5.22 x10E6/uL (ref 4.14–5.80)
RDW: 14.5 % (ref 11.6–15.4)
WBC: 7 10*3/uL (ref 3.4–10.8)

## 2023-05-17 LAB — POC HEMOCCULT BLD/STL (OFFICE/1-CARD/DIAGNOSTIC): Fecal Occult Blood, POC: POSITIVE — AB

## 2023-05-17 NOTE — Assessment & Plan Note (Signed)
 Positive guiac. Advised to avoid red fruits and vegetables for his juicing. Advised if symptoms worsen he is to ER for evaluation.

## 2023-05-17 NOTE — Patient Instructions (Signed)
 Avoid red fruits/vegetables. Make sure you are not constipated If worsening bleeding go to ER, if new symptoms call to office.

## 2023-05-17 NOTE — Progress Notes (Signed)
 LILLETTE Kristeen JINNY Gladis, CMA,acting as a neurosurgeon for [name], FNP.,have documented all relevant documentation on the behalf of [name], FNP,as directed by  [name], FNP while in the presence of [name], FNP.  Subjective:  Patient ID: Erik West , male    DOB: 01-Jul-1980 , 43 y.o.   MRN: [id number]  Chief Complaint  Patient presents with   Blood In Stools    HPI  Patient presents today for bright red blood in stool patient reports it first started yesterday. Patient reports it happen 2 times yesterday. Patient reports he has been juicing daily and was thinking it was on of the fruits. He did use watermelon, strawberries and beets in his juices. The bloody stool was before he drank the beet juice.      Past Medical History:  Diagnosis Date   Essential hypertension    Hypersomnia    Sleep apnea    diagnosed maybe a year ago uses CPAP   Uncontrolled hypertension 11/05/2022     Family History  Problem Relation Age of Onset   Hypertension Paternal Grandmother    Cancer Other      Current Outpatient Medications:    albuterol  (VENTOLIN  HFA) 108 (90 Base) MCG/ACT inhaler, Inhale 2 puffs into the lungs every 6 (six) hours as needed for wheezing or shortness of breath., Disp: 8 g, Rfl: 2   amLODipine  (NORVASC ) 5 MG tablet, Take 1 tablet (5 mg total) by mouth daily., Disp: 90 tablet, Rfl: 1   cetirizine  (ZYRTEC ) 10 MG tablet, TAKE 1 TABLET BY MOUTH EVERY DAY (Patient taking differently: Take 10 mg by mouth daily as needed for allergies or rhinitis.), Disp: 90 tablet, Rfl: 0   fluticasone  (FLONASE ) 50 MCG/ACT nasal spray, Place 1 spray into both nostrils 2 (two) times daily as needed for rhinitis or allergies., Disp: , Rfl:    lisinopril  (ZESTRIL ) 10 MG tablet, Take 1 tablet (10 mg total) by mouth daily., Disp: 90 tablet, Rfl: 1   methocarbamol  (ROBAXIN ) 500 MG tablet, Take 500 mg by mouth every 6 (six) hours as needed for muscle spasms (or tension)., Disp: , Rfl:     multivitamin (ONE-A-DAY MEN'S) TABS tablet, Take 1 tablet by mouth daily with breakfast., Disp: , Rfl:    TYLENOL  500 MG tablet, Take 500-1,000 mg by mouth every 6 (six) hours as needed for mild pain or headache., Disp: , Rfl:    valsartan  (DIOVAN ) 160 MG tablet, TAKE 1 TABLET BY MOUTH EVERY DAY, Disp: 90 tablet, Rfl: 1   No Known Allergies   Review of Systems  Constitutional: Negative.   Respiratory: Negative.    Cardiovascular: Negative.   Neurological: Negative.   Psychiatric/Behavioral: Negative.       Today's Vitals   05/17/23 1525  BP: 130/86  Pulse: 96  Temp: 98.9 F (37.2 C)  TempSrc: Oral  Weight: 253 lb 3.2 oz (114.9 kg)  Height: 5' 11 (1.803 m)  PainSc: 0-No pain   Body mass index is 35.31 kg/m.  Wt Readings from Last 3 Encounters:  05/17/23 253 lb 3.2 oz (114.9 kg)  05/03/23 249 lb 3.2 oz (113 kg)  02/13/23 234 lb (106.1 kg)    The 10-year ASCVD risk score (Arnett DK, et al., 2019) is: 6.1%   Values used to calculate the score:     Age: 66 years     Sex: Male     Is Non-Hispanic African American: Yes     Diabetic: No     Tobacco  smoker: No     Systolic Blood Pressure: 130 mmHg     Is BP treated: Yes     HDL Cholesterol: 48 mg/dL     Total Cholesterol: 210 mg/dL  Objective:  Physical Exam Vitals reviewed.  Constitutional:      General: He is not in acute distress.    Appearance: Normal appearance.  Pulmonary:     Effort: Pulmonary effort is normal. No respiratory distress.  Genitourinary:    Rectum: Guaiac result positive (positive). No mass, tenderness, anal fissure, external hemorrhoid or internal hemorrhoid.  Skin:    General: Skin is warm and dry.     Capillary Refill: Capillary refill takes less than 2 seconds.  Neurological:     General: No focal deficit present.     Mental Status: He is alert and oriented to person, place, and time.     Cranial Nerves: No cranial nerve deficit.     Motor: No weakness.       Assessment And Plan:   Blood in stool Assessment & Plan: Positive guiac. Advised to avoid red fruits and vegetables for his juicing. Advised if symptoms worsen he is to ER for evaluation.   Orders: -     Ambulatory referral to Gastroenterology -     CBC with Differential/Platelet -     POC Hemoccult Bld/Stl (1-Cd Office Dx)    Return for keep same next.  Patient was given opportunity to ask questions. Patient verbalized understanding of the plan and was able to repeat key elements of the plan. All questions were answered to their satisfaction.    LILLETTE [name], FNP, have reviewed all documentation for this visit. The documentation on 05/17/23 for the exam, diagnosis, procedures, and orders are all accurate and complete.   IF YOU HAVE BEEN REFERRED TO A SPECIALIST, IT MAY TAKE 1-2 WEEKS TO SCHEDULE/PROCESS THE REFERRAL. IF YOU HAVE NOT HEARD FROM US /SPECIALIST IN TWO WEEKS, PLEASE GIVE US  A CALL AT [phone].

## 2023-07-18 ENCOUNTER — Other Ambulatory Visit: Payer: Self-pay | Admitting: Nurse Practitioner

## 2023-07-18 ENCOUNTER — Encounter: Payer: Self-pay | Admitting: Nurse Practitioner

## 2023-07-18 DIAGNOSIS — I10 Essential (primary) hypertension: Secondary | ICD-10-CM

## 2023-08-15 ENCOUNTER — Encounter: Payer: Self-pay | Admitting: Nurse Practitioner

## 2023-08-16 ENCOUNTER — Other Ambulatory Visit: Payer: Self-pay

## 2023-08-16 DIAGNOSIS — I10 Essential (primary) hypertension: Secondary | ICD-10-CM

## 2023-08-16 MED ORDER — VALSARTAN 160 MG PO TABS: 160.0000 mg | ORAL_TABLET | Freq: Every day | ORAL | 1 refills | Status: DC

## 2023-08-16 MED ORDER — AMLODIPINE BESYLATE 5 MG PO TABS
5.0000 mg | ORAL_TABLET | Freq: Every day | ORAL | 1 refills | Status: DC
Start: 2023-08-16 — End: 2023-12-24

## 2023-09-04 ENCOUNTER — Ambulatory Visit: Payer: Managed Care, Other (non HMO) | Admitting: Nurse Practitioner

## 2023-09-04 NOTE — Progress Notes (Deleted)
 Del Favia, CMA,acting as a Neurosurgeon for Erik Epley, FNP.,have documented all relevant documentation on the behalf of Erik Epley, FNP,as directed by  Erik Epley, FNP while in the presence of Erik Epley, FNP.  Subjective:  Patient ID: Erik West , male    DOB: Oct 18, 1980 , 43 y.o.   MRN: [id number]  No chief complaint on file.   HPI  HPI   Past Medical History:  Diagnosis Date   Essential hypertension    Hypersomnia    Sleep apnea    diagnosed "maybe a year ago" uses CPAP   Uncontrolled hypertension 11/05/2022     Family History  Problem Relation Age of Onset   Hypertension Paternal Grandmother    Cancer Other      Current Outpatient Medications:    albuterol  (VENTOLIN  HFA) 108 (90 Base) MCG/ACT inhaler, Inhale 2 puffs into the lungs every 6 (six) hours as needed for wheezing or shortness of breath., Disp: 8 g, Rfl: 2   amLODipine  (NORVASC ) 5 MG tablet, Take 1 tablet (5 mg total) by mouth daily., Disp: 90 tablet, Rfl: 1   cetirizine  (ZYRTEC ) 10 MG tablet, TAKE 1 TABLET BY MOUTH EVERY DAY (Patient taking differently: Take 10 mg by mouth daily as needed for allergies or rhinitis.), Disp: 90 tablet, Rfl: 0   fluticasone  (FLONASE ) 50 MCG/ACT nasal spray, Place 1 spray into both nostrils 2 (two) times daily as needed for rhinitis or allergies., Disp: , Rfl:    lisinopril  (ZESTRIL ) 10 MG tablet, Take 1 tablet (10 mg total) by mouth daily., Disp: 90 tablet, Rfl: 1   methocarbamol  (ROBAXIN ) 500 MG tablet, Take 500 mg by mouth every 6 (six) hours as needed for muscle spasms (or tension)., Disp: , Rfl:    multivitamin (ONE-A-DAY MEN'S) TABS tablet, Take 1 tablet by mouth daily with breakfast., Disp: , Rfl:    TYLENOL  500 MG tablet, Take 500-1,000 mg by mouth every 6 (six) hours as needed for mild pain or headache., Disp: , Rfl:    valsartan  (DIOVAN ) 160 MG tablet, Take 1 tablet (160 mg total) by mouth daily., Disp: 90 tablet, Rfl: 1   No Known Allergies   Review of  Systems   There were no vitals filed for this visit. There is no height or weight on file to calculate BMI.  Wt Readings from Last 3 Encounters:  05/17/23 253 lb 3.2 oz (114.9 kg)  05/03/23 249 lb 3.2 oz (113 kg)  02/13/23 234 lb (106.1 kg)    The 10-year ASCVD risk score (Arnett DK, et al., 2019) is: 6.1%   Values used to calculate the score:     Age: 55 years     Sex: Male     Is Non-Hispanic African American: Yes     Diabetic: No     Tobacco smoker: No     Systolic Blood Pressure: 130 mmHg     Is BP treated: Yes     HDL Cholesterol: 48 mg/dL     Total Cholesterol: 210 mg/dL  Objective:  Physical Exam      Assessment And Plan:  Essential hypertension  Abnormal glucose    No follow-ups on file.  Patient was given opportunity to ask questions. Patient verbalized understanding of the plan and was able to repeat key elements of the plan. All questions were answered to their satisfaction.    Inge Mangle, FNP, have reviewed all documentation for this visit. The documentation on 09/04/23 for the exam, diagnosis, procedures, and orders  are all accurate and complete.   IF YOU HAVE BEEN REFERRED TO A SPECIALIST, IT MAY TAKE 1-2 WEEKS TO SCHEDULE/PROCESS THE REFERRAL. IF YOU HAVE NOT HEARD FROM US /SPECIALIST IN TWO WEEKS, PLEASE GIVE US  A CALL AT [phone].

## 2023-09-19 ENCOUNTER — Encounter: Payer: Self-pay | Admitting: Gastroenterology

## 2023-09-26 ENCOUNTER — Ambulatory Visit: Admitting: Nurse Practitioner

## 2023-09-26 NOTE — Progress Notes (Deleted)
 Del Favia, CMA,acting as a Neurosurgeon for Erik Epley, FNP.,have documented all relevant documentation on the behalf of Erik Epley, FNP,as directed by  Erik Epley, FNP while in the presence of Erik Epley, FNP.  Subjective:  Patient ID: Erik West , male    DOB: [birth year] , 43 y.o.   MRN: [id number]  No chief complaint on file.   HPI  HPI   Past Medical History:  Diagnosis Date   Essential hypertension    Hypersomnia    Sleep apnea    diagnosed maybe a year ago uses CPAP   Uncontrolled hypertension 11/05/2022     Family History  Problem Relation Age of Onset   Hypertension Paternal Grandmother    Cancer Other      Current Outpatient Medications:    albuterol  (VENTOLIN  HFA) 108 (90 Base) MCG/ACT inhaler, Inhale 2 puffs into the lungs every 6 (six) hours as needed for wheezing or shortness of breath., Disp: 8 g, Rfl: 2   amLODipine  (NORVASC ) 5 MG tablet, Take 1 tablet (5 mg total) by mouth daily., Disp: 90 tablet, Rfl: 1   cetirizine  (ZYRTEC ) 10 MG tablet, TAKE 1 TABLET BY MOUTH EVERY DAY (Patient taking differently: Take 10 mg by mouth daily as needed for allergies or rhinitis.), Disp: 90 tablet, Rfl: 0   fluticasone  (FLONASE ) 50 MCG/ACT nasal spray, Place 1 spray into both nostrils 2 (two) times daily as needed for rhinitis or allergies., Disp: , Rfl:    lisinopril  (ZESTRIL ) 10 MG tablet, Take 1 tablet (10 mg total) by mouth daily., Disp: 90 tablet, Rfl: 1   methocarbamol  (ROBAXIN ) 500 MG tablet, Take 500 mg by mouth every 6 (six) hours as needed for muscle spasms (or tension)., Disp: , Rfl:    multivitamin (ONE-A-DAY MEN'S) TABS tablet, Take 1 tablet by mouth daily with breakfast., Disp: , Rfl:    TYLENOL  500 MG tablet, Take 500-1,000 mg by mouth every 6 (six) hours as needed for mild pain or headache., Disp: , Rfl:    valsartan  (DIOVAN ) 160 MG tablet, Take 1 tablet (160 mg total) by mouth daily., Disp: 90 tablet, Rfl: 1   No Known Allergies   Review of  Systems   There were no vitals filed for this visit. There is no height or weight on file to calculate BMI.  Wt Readings from Last 3 Encounters:  05/17/23 253 lb 3.2 oz (114.9 kg)  05/03/23 249 lb 3.2 oz (113 kg)  02/13/23 234 lb (106.1 kg)    The 10-year ASCVD risk score (Arnett DK, et al., 2019) is: 6.1%   Values used to calculate the score:     Age: 42 years     Clincally relevant sex: Male     Is Non-Hispanic African American: Yes     Diabetic: No     Tobacco smoker: No     Systolic Blood Pressure: 130 mmHg     Is BP treated: Yes     HDL Cholesterol: 48 mg/dL     Total Cholesterol: 210 mg/dL  Objective:  Physical Exam      Assessment And Plan:  Elevated cholesterol  Abnormal glucose  Essential hypertension    No follow-ups on file.  Patient was given opportunity to ask questions. Patient verbalized understanding of the plan and was able to repeat key elements of the plan. All questions were answered to their satisfaction.    Inge Mangle, FNP, have reviewed all documentation for this visit. The documentation on 09/26/23 for the  exam, diagnosis, procedures, and orders are all accurate and complete.   IF YOU HAVE BEEN REFERRED TO A SPECIALIST, IT MAY TAKE 1-2 WEEKS TO SCHEDULE/PROCESS THE REFERRAL. IF YOU HAVE NOT HEARD FROM US /SPECIALIST IN TWO WEEKS, PLEASE GIVE US  A CALL AT [phone].

## 2023-10-04 ENCOUNTER — Ambulatory Visit: Payer: Self-pay | Admitting: Gastroenterology

## 2023-10-04 ENCOUNTER — Encounter: Payer: Self-pay | Admitting: Gastroenterology

## 2023-10-04 VITALS — BP 122/78 | HR 86 | Ht 71.0 in | Wt 263.0 lb

## 2023-10-04 DIAGNOSIS — R195 Other fecal abnormalities: Secondary | ICD-10-CM

## 2023-10-04 DIAGNOSIS — K219 Gastro-esophageal reflux disease without esophagitis: Secondary | ICD-10-CM | POA: Diagnosis not present

## 2023-10-04 DIAGNOSIS — R12 Heartburn: Secondary | ICD-10-CM

## 2023-10-04 MED ORDER — NA SULFATE-K SULFATE-MG SULF 17.5-3.13-1.6 GM/177ML PO SOLN: 1.0000 | Freq: Once | ORAL | 0 refills | Status: AC

## 2023-10-04 NOTE — Patient Instructions (Signed)
 You have been scheduled for a colonoscopy. Please follow written instructions given to you at your visit today.   If you use inhalers (even only as needed), please bring them with you on the day of your procedure.  DO NOT TAKE 7 DAYS PRIOR TO TEST- Trulicity (dulaglutide) Ozempic, Wegovy (semaglutide) Mounjaro (tirzepatide) Bydureon Bcise (exanatide extended release)  DO NOT TAKE 1 DAY PRIOR TO YOUR TEST Rybelsus (semaglutide) Adlyxin (lixisenatide) Victoza (liraglutide) Byetta (exanatide) ___________________________________________________________________________  _______________________________________________________  If your blood pressure at your visit was 140/90 or greater, please contact your primary care physician to follow up on this.  _______________________________________________________  If you are age 37 or older, your body mass index should be between 23-30. Your Body mass index is 36.68 kg/m. If this is out of the aforementioned range listed, please consider follow up with your Primary Care Provider.  If you are age 48 or younger, your body mass index should be between 19-25. Your Body mass index is 36.68 kg/m. If this is out of the aformentioned range listed, please consider follow up with your Primary Care Provider.   ________________________________________________________  The [facility] providers would like to encourage you to use MYCHART to communicate with providers for non-urgent requests or questions.  Due to long hold times on the telephone, sending your provider a message by [facility] may be a faster and more efficient way to get a response.  Please allow 48 business hours for a response.  Please remember that this is for non-urgent requests.  _______________________________________________________

## 2023-10-04 NOTE — Progress Notes (Signed)
 Discussed the use of AI scribe software for clinical note transcription with the patient, who gave verbal consent to proceed.  HPI : Erik West is a 43 year old male who presents with blood in stool. He was referred for evaluation of blood in stool.  He noticed blood in his stool approximately three months ago, initially attributing it to beet consumption. The appearance was described as 'a crime scene' after consuming beets. A positive stool test in February prompted the referral for further evaluation.  After stopping beet consumption, the abnormal stools resolved. However, upon reintroducing beets, the red stool reappeared, described as worse than the first occurrence. Since stopping beets again, he has not observed any further blood in his stool.  No other concerning symptoms such as diarrhea, constipation, abdominal pain, nausea, vomiting, or unintentional weight loss. He reports regular bowel movements and stable weight, although he is trying to lose weight after starting testosterone shots.  He experiences occasional acid reflux, particularly after consuming orange juice or eating late at night, but these episodes are infrequent.  He has a family history of some type of gastrointestinal cancer, though he is unsure if it was stomach or colon cancer. His parents have no known history of colon cancer and have likely not undergone colonoscopy.  He has a history of sleep apnea and asthma, with allergies that exacerbate his asthma symptoms. He has undergone previous surgeries, including gallbladder removal last year, without complications related to anesthesia.       Component Ref Range & Units (hover) 4 mo ago  Card #1 Date 05/17/2023  Fecal Occult Blood, POC Positive Abnormal     Past Medical History:  Diagnosis Date   Essential hypertension    Hypersomnia    Sleep apnea    diagnosed maybe a year ago uses CPAP   Uncontrolled hypertension 11/05/2022     Past Surgical  History:  Procedure Laterality Date   ANTERIOR CERVICAL DECOMPRESSION/DISCECTOMY FUSION 4 LEVELS N/A 07/03/2019   Procedure: ANTERIOR CERVICAL DECOMPRESSION FUSION CERVICAL 4-5, CERVICAL 5-6, CERVICAL 6-7 WITH INSTRUMENTATION AND ALLOGRAFT;  Surgeon: Beuford Anes, MD;  Location: [facility] OR;  Service: Orthopedics;  Laterality: N/A;   CHOLECYSTECTOMY N/A 08/23/2022   Procedure: LAPAROSCOPIC CHOLECYSTECTOMY;  Surgeon: Tanda Locus, MD;  Location: [facility];  Service: General;  Laterality: N/A;   Family History  Problem Relation Age of Onset   Hypertension Paternal Grandmother    Cancer Other    Social History   Tobacco Use   Smoking status: Never   Smokeless tobacco: Never  Vaping Use   Vaping status: Never Used  Substance Use Topics   Alcohol  use: Yes    Comment: occ   Drug use: Never   Current Outpatient Medications  Medication Sig Dispense Refill   albuterol  (VENTOLIN  HFA) 108 (90 Base) MCG/ACT inhaler Inhale 2 puffs into the lungs every 6 (six) hours as needed for wheezing or shortness of breath. 8 g 2   amLODipine  (NORVASC ) 5 MG tablet Take 1 tablet (5 mg total) by mouth daily. 90 tablet 1   cetirizine  (ZYRTEC ) 10 MG tablet TAKE 1 TABLET BY MOUTH EVERY DAY (Patient taking differently: Take 10 mg by mouth daily as needed for allergies or rhinitis.) 90 tablet 0   fluticasone  (FLONASE ) 50 MCG/ACT nasal spray Place 1 spray into both nostrils 2 (two) times daily as needed for rhinitis or allergies.     lisinopril  (ZESTRIL ) 10 MG tablet Take 1 tablet (10 mg total) by mouth daily. 90 tablet  1   methocarbamol  (ROBAXIN ) 500 MG tablet Take 500 mg by mouth every 6 (six) hours as needed for muscle spasms (or tension).     multivitamin (ONE-A-DAY MEN'S) TABS tablet Take 1 tablet by mouth daily with breakfast.     TYLENOL  500 MG tablet Take 500-1,000 mg by mouth every 6 (six) hours as needed for mild pain or headache.     valsartan  (DIOVAN ) 160 MG tablet Take 1 tablet (160 mg total) by mouth daily. 90  tablet 1   No current facility-administered medications for this visit.   No Known Allergies   Review of Systems: All systems reviewed and negative except where noted in HPI.    No results found.  Physical Exam: BP 122/78   Pulse 86   Ht 5' 11 (1.803 m)   Wt 263 lb (119.3 kg)   BMI 36.68 kg/m  Constitutional: Pleasant,well-developed, African American male in no acute distress.  Accompanied by wife HEENT: Normocephalic and atraumatic. Conjunctivae are normal. No scleral icterus.  Mallampati 2 Neck supple.  Cardiovascular: Normal rate, regular rhythm.  Pulmonary/chest: Effort normal and breath sounds normal. No wheezing, rales or rhonchi. Abdominal: Soft, nondistended, nontender. Bowel sounds active throughout. There are no masses palpable. No hepatomegaly. Extremities: no edema Neurological: Alert and oriented to person place and time. Skin: Skin is warm and dry. No rashes noted. Psychiatric: Normal mood and affect. Behavior is normal.  CBC    Component Value Date/Time   WBC 7.0 05/17/2023 1556   WBC 12.9 (H) 08/24/2022 0433   RBC 5.22 05/17/2023 1556   RBC 5.02 08/24/2022 0433   HGB 15.0 05/17/2023 1556   HCT 45.1 05/17/2023 1556   PLT 249 05/17/2023 1556   MCV 86 05/17/2023 1556   MCH 28.7 05/17/2023 1556   MCH 28.3 08/24/2022 0433   MCHC 33.3 05/17/2023 1556   MCHC 31.7 08/24/2022 0433   RDW 14.5 05/17/2023 1556   LYMPHSABS 1.9 05/17/2023 1556   MONOABS 0.7 03/22/2022 2224   EOSABS 0.2 05/17/2023 1556   BASOSABS 0.0 05/17/2023 1556    CMP     Component Value Date/Time   NA 138 01/16/2023 1650   K 4.0 01/16/2023 1650   CL 99 01/16/2023 1650   CO2 21 01/16/2023 1650   GLUCOSE 94 01/16/2023 1650   GLUCOSE 123 (H) 08/24/2022 0433   BUN 24 01/16/2023 1650   CREATININE 1.43 (H) 01/16/2023 1650   CALCIUM 10.1 01/16/2023 1650   PROT 7.6 01/16/2023 1650   ALBUMIN  4.4 01/16/2023 1650   AST 24 01/16/2023 1650   ALT 25 01/16/2023 1650   ALKPHOS 100  01/16/2023 1650   BILITOT 0.4 01/16/2023 1650   GFRNONAA >60 08/24/2022 0433   GFRAA 90 03/18/2020 1720       Latest Ref Rng & Units 05/17/2023    3:56 PM 01/16/2023    4:50 PM 08/24/2022    4:33 AM  CBC EXTENDED  WBC 3.4 - 10.8 x10E3/uL 7.0  7.0  12.9   RBC 4.14 - 5.80 x10E6/uL 5.22  5.44  5.02   Hemoglobin 13.0 - 17.7 g/dL 84.9  84.6  85.7   HCT 37.5 - 51.0 % 45.1  47.6  44.8   Platelets 150 - 450 x10E3/uL 249  246  281   NEUT# 1.4 - 7.0 x10E3/uL 4.2  4.1    Lymph# 0.7 - 3.1 x10E3/uL 1.9  1.9        ASSESSMENT AND PLAN:  43 year old male with bloody appearance to stools in  the setting of being consumption, with subsequent positive fecal occult blood test.  He has not had any further overtly bloody stools since he stopped eating beets, but given the positive fecal occult blood test a colonoscopy is warranted.  Positive stool test for blood Bloody appearing stools likely due to beet consumption. No alarming symptoms or significant family history of colon cancer.  FOBT positive - Schedule colonoscopy given positive FOBT  Gastroesophageal reflux Occasional reflux symptoms linked to dietary habits. No need for regular medication. - Advise dietary modifications to reduce reflux, including avoiding late meals and acidic foods. - Recommend antacids as needed for symptoms.  Asthma Asthma with allergy-related exacerbations. No current issues. - Monitor symptoms and manage allergies as needed.  Sleep apnea Sleep apnea with previous surgeries without anesthesia complications. Anesthesia provider to manage airway during colonoscopy. - Ensure anesthesia provider is aware of sleep apnea for airway management during colonoscopy.  Recording duration: 13 minutes     The details, risks (including bleeding, perforation, infection, missed lesions, medication reactions and possible hospitalization or surgery if complications occur), benefits, and alternatives to colonoscopy with possible biopsy  and possible polypectomy were discussed with the patient and he consents to proceed.     Georgina Speaks, FNP

## 2023-10-30 ENCOUNTER — Ambulatory Visit: Admitting: Nurse Practitioner

## 2023-11-06 ENCOUNTER — Encounter: Payer: Self-pay | Admitting: Cardiovascular Disease

## 2023-11-06 ENCOUNTER — Ambulatory Visit: Payer: Self-pay | Attending: Internal Medicine | Admitting: Cardiovascular Disease

## 2023-11-06 VITALS — BP 130/80 | HR 91 | Ht 71.0 in | Wt 256.0 lb

## 2023-11-06 DIAGNOSIS — G4733 Obstructive sleep apnea (adult) (pediatric): Secondary | ICD-10-CM | POA: Diagnosis not present

## 2023-11-06 DIAGNOSIS — R030 Elevated blood-pressure reading, without diagnosis of hypertension: Secondary | ICD-10-CM | POA: Diagnosis not present

## 2023-11-06 DIAGNOSIS — I10 Essential (primary) hypertension: Secondary | ICD-10-CM | POA: Diagnosis not present

## 2023-11-06 DIAGNOSIS — E7800 Pure hypercholesterolemia, unspecified: Secondary | ICD-10-CM | POA: Diagnosis not present

## 2023-11-06 NOTE — Assessment & Plan Note (Signed)
 History of essential hypertension with blood pressure measured today at 130/80.  He is on amlodipine  and valsartan .  I have asked him to keep a 2-week blood pressure log and to see one of our Pharm.D.'s after that to review make appropriate changes.

## 2023-11-06 NOTE — Assessment & Plan Note (Signed)
 History of hyperlipidemia not on statin therapy with lipid profile performed 05/03/2023 revealing total cholesterol 210, LDL 147 and HDL 48.  I am going to get a coronary calcium score to her stratify.

## 2023-11-06 NOTE — Progress Notes (Signed)
 11/06/2023 Erik West   1980-05-31  [id number]  Primary Physician Georgina Speaks, FNP Primary Cardiologist: Dorn JINNY Lesches MD [facility] [place], [place]  HPI:  Erik West is a 43 y.o. mildly overweight married African-American male father of 3 children who works in a Surveyor, minerals loading trucks.  He was referred by his primary care provider, Speaks Georgina FNP, for cardiovascular dilation because of hypertension.  His risk factors include treated hypertension hyperlipidemia.  He does not smoke.  There is no family history of heart disease.  Is never had a heart attack or stroke.  He does have obstructive sleep apnea on CPAP.  He has had hypertension for several years on medications.  He recently got started on testosterone replacement therapy.  He denies chest pain or shortness of breath.   Current Meds  Medication Sig   albuterol  (VENTOLIN  HFA) 108 (90 Base) MCG/ACT inhaler Inhale 2 puffs into the lungs every 6 (six) hours as needed for wheezing or shortness of breath.   amLODipine  (NORVASC ) 5 MG tablet Take 1 tablet (5 mg total) by mouth daily.   anastrozole (ARIMIDEX) 1 MG tablet Take 1 mg by mouth once a week.   cetirizine  (ZYRTEC ) 10 MG tablet TAKE 1 TABLET BY MOUTH EVERY DAY (Patient taking differently: Take 10 mg by mouth daily as needed for allergies or rhinitis.)   fluticasone  (FLONASE ) 50 MCG/ACT nasal spray Place 1 spray into both nostrils 2 (two) times daily as needed for rhinitis or allergies.   methocarbamol  (ROBAXIN ) 500 MG tablet Take 500 mg by mouth every 6 (six) hours as needed for muscle spasms (or tension).   multivitamin (ONE-A-DAY MEN'S) TABS tablet Take 1 tablet by mouth daily with breakfast.   testosterone cypionate (DEPOTESTOSTERONE CYPIONATE) 200 MG/ML injection Inject 1.3 mLs into the muscle once a week.   TYLENOL  500 MG tablet Take 500-1,000 mg by mouth every 6 (six) hours as needed for mild pain or headache.   valsartan  (DIOVAN ) 160 MG tablet  Take 1 tablet (160 mg total) by mouth daily.   VITAMIN D  PO Take 1 tablet by mouth daily at 6 (six) AM.     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    Social Drivers of Corporate investment banker Strain: Not on file  Food Insecurity: No Food Insecurity (08/23/2022)   Hunger Vital Sign    Worried About Running Out of Food in the Last Year: Never true    Ran Out of Food in the Last Year: Never true  Transportation Needs: No Transportation Needs (08/23/2022)   PRAPARE - Administrator, Civil Service (Medical): No    Lack of Transportation (Non-Medical): No  Physical Activity: Insufficiently Active (  02/19/2018)   Exercise Vital Sign    Days of Exercise per Week: 2 days    Minutes of Exercise per Session: 30 min  Stress: Not on file  Social Connections: Not on file  Intimate Partner Violence: Not At Risk (08/23/2022)   Humiliation, Afraid, Rape, and Kick questionnaire    Fear of Current or Ex-Partner: No    Emotionally Abused: No    Physically Abused: No    Sexually Abused: No     Review of Systems: General: negative for chills, fever, night sweats or weight changes.  Cardiovascular: negative for chest pain, dyspnea on exertion, edema, orthopnea, palpitations, paroxysmal nocturnal dyspnea or shortness of breath Dermatological: negative for rash Respiratory: negative for cough or  wheezing Urologic: negative for hematuria Abdominal: negative for nausea, vomiting, diarrhea, bright red blood per rectum, melena, or hematemesis Neurologic: negative for visual changes, syncope, or dizziness All other systems reviewed and are otherwise negative except as noted above.    Blood pressure 130/80, pulse 91, height 5' 11 (1.803 m), weight 256 lb (116.1 kg), SpO2 97%.  General appearance: alert and no distress Neck: no adenopathy, no carotid bruit, no JVD, supple, symmetrical, trachea midline, and thyroid  not enlarged, symmetric, no tenderness/mass/nodules Lungs: clear to auscultation bilaterally Heart: regular rate and rhythm, S1, S2 normal, no murmur, click, rub or gallop Extremities: extremities normal, atraumatic, no cyanosis or edema Pulses: 2+ and symmetric Skin: Skin color, texture, turgor normal. No rashes or lesions Neurologic: Grossly normal  EKG EKG Interpretation Date/Time:  Tuesday November 06 2023 14:57:04 EDT Ventricular Rate:  90 PR Interval:  156 QRS Duration:  74 QT Interval:  346 QTC Calculation: 423 R Axis:   5  Text Interpretation: Normal sinus rhythm Nonspecific T wave abnormality No previous ECGs available Confirmed by Court Carrier [phone]) on 11/06/2023 3:09:13 PM    ASSESSMENT AND PLAN:   OSA (obstructive sleep apnea) History of obstructive sleep apnea on CPAP  Elevated blood pressure reading History of essential hypertension with blood pressure measured today at 130/80.  He is on amlodipine  and valsartan .  I have asked him to keep a 2-week blood pressure log and to see one of our Pharm.D.'s after that to review make appropriate changes.  Elevated cholesterol History of hyperlipidemia not on statin therapy with lipid profile performed 05/03/2023 revealing total cholesterol 210, LDL 147 and HDL 48.  I am going to get a coronary calcium score to her stratify.     Carrier DOROTHA Court MD FACP,FACC,FAHA, [facility] 11/06/2023 3:21 PM

## 2023-11-06 NOTE — Assessment & Plan Note (Signed)
 History of obstructive sleep apnea on CPAP.

## 2023-11-06 NOTE — Patient Instructions (Addendum)
 Medication Instructions:  Your physician recommends that you continue on your current medications as directed. Please refer to the Current Medication list given to you today.  *If you need a refill on your cardiac medications before your next appointment, please call your pharmacy*   Testing/Procedures: Dr. Court has ordered a CT coronary calcium score.   Test locations:  [facility] at [facility]  [facility] at [facility]  This is $99 out of pocket.   Coronary CalciumScan A coronary calcium scan is an imaging test used to look for deposits of calcium and other fatty materials (plaques) in the inner lining of the blood vessels of the heart (coronary arteries). These deposits of calcium and plaques can partly clog and narrow the coronary arteries without producing any symptoms or warning signs. This puts a person at risk for a heart attack. This test can detect these deposits before symptoms develop. Tell a health care provider about: Any allergies you have. All medicines you are taking, including vitamins, herbs, eye drops, creams, and over-the-counter medicines. Any problems you or family members have had with anesthetic medicines. Any blood disorders you have. Any surgeries you have had. Any medical conditions you have. Whether you are pregnant or may be pregnant. What are the risks? Generally, this is a safe procedure. However, problems may occur, including: Harm to a pregnant woman and her unborn baby. This test involves the use of radiation. Radiation exposure can be dangerous to a pregnant woman and her unborn baby. If you are pregnant, you generally should not have this procedure done. Slight increase in the risk of cancer. This is because of the radiation involved in the test. What happens before the procedure? No preparation is needed for this procedure. What happens  during the procedure? You will undress and remove any jewelry around your neck or chest. You will put on a hospital gown. Sticky electrodes will be placed on your chest. The electrodes will be connected to an electrocardiogram (ECG) machine to record a tracing of the electrical activity of your heart. A CT scanner will take pictures of your heart. During this time, you will be asked to lie still and hold your breath for 2-3 seconds while a picture of your heart is being taken. The procedure may vary among health care providers and hospitals. What happens after the procedure? You can get dressed. You can return to your normal activities. It is up to you to get the results of your test. Ask your health care provider, or the department that is doing the test, when your results will be ready. Summary A coronary calcium scan is an imaging test used to look for deposits of calcium and other fatty materials (plaques) in the inner lining of the blood vessels of the heart (coronary arteries). Generally, this is a safe procedure. Tell your health care provider if you are pregnant or may be pregnant. No preparation is needed for this procedure. A CT scanner will take pictures of your heart. You can return to your normal activities after the scan is done. This information is not intended to replace advice given to you by your health care provider. Make sure you discuss any questions you have with your health care provider. Document Released: 09/23/2007 Document Revised: 02/14/2016 Document Reviewed: 02/14/2016 Elsevier Interactive Patient Education  2017 [employer].   Follow-Up: At [facility], you and your health needs are our priority.  As part of our continuing mission to provide you with exceptional heart care, our providers are all part of one team.  This team includes your primary Cardiologist (physician) and Advanced Practice Providers or APPs (Physician Assistants and Nurse  Practitioners) who all work together to provide you with the care you need, when you need it.  Your next appointment:   3 month(s)  Provider:   Josefa Beauvais, NP, Orren Fabry, PA-C, Rollo Louder, PA-C, Glendia Ferrier, PA-C, or Katlyn West, NP         Then, Dorn Lesches, MD will plan to see you again in 6 month(s).     We recommend signing up for the patient portal called MyChart.  Sign up information is provided on this After Visit Summary.  MyChart is used to connect with patients for Virtual Visits (Telemedicine).  Patients are able to view lab/test results, encounter notes, upcoming appointments, etc.  Non-urgent messages can be sent to your provider as well.   To learn more about what you can do with MyChart, go to [email or web address].   Other Instructions Dr. Lesches has requested that you schedule an appointment with one of our clinical pharmacists for a blood pressure check appointment within the next 5-6 weeks.  If you monitor your blood pressure (BP) at home, please bring your BP cuff and your BP readings with you to this appointment  HOW TO TAKE YOUR BLOOD PRESSURE: Rest 5 minutes before taking your blood pressure. Don't smoke or drink caffeinated beverages for at least 30 minutes before. Take your blood pressure before (not after) you eat. Sit comfortably with your back supported and both feet on the floor (don't cross your legs). Elevate your arm to heart level on a table or a desk. Use the proper sized cuff. It should fit smoothly and snugly around your bare upper arm. There should be enough room to slip a fingertip under the cuff. The bottom edge of the cuff should be 1 inch above the crease of the elbow. Ideally, take 3 measurements at one sitting and record the average.

## 2023-11-13 ENCOUNTER — Encounter: Payer: Self-pay | Admitting: Gastroenterology

## 2023-11-20 ENCOUNTER — Encounter: Payer: Self-pay | Admitting: Gastroenterology

## 2023-11-20 ENCOUNTER — Ambulatory Visit: Admitting: Gastroenterology

## 2023-11-20 VITALS — BP 166/95 | HR 88 | Temp 97.8°F | Resp 20 | Ht 71.0 in | Wt 263.0 lb

## 2023-11-20 DIAGNOSIS — R195 Other fecal abnormalities: Secondary | ICD-10-CM

## 2023-11-20 DIAGNOSIS — Z1211 Encounter for screening for malignant neoplasm of colon: Secondary | ICD-10-CM

## 2023-11-20 DIAGNOSIS — K5730 Diverticulosis of large intestine without perforation or abscess without bleeding: Secondary | ICD-10-CM

## 2023-11-20 MED ORDER — SODIUM CHLORIDE 0.9 % IV SOLN: 500.0000 mL | Freq: Once | INTRAVENOUS | Status: AC

## 2023-11-20 NOTE — Patient Instructions (Signed)
   Handout on diverticulosis given to you    Continue previous diet & medications   YOU HAD AN ENDOSCOPIC PROCEDURE TODAY AT THE [facility]:   Refer to the procedure report that was given to you for any specific questions about what was found during the examination.  If the procedure report does not answer your questions, please call your gastroenterologist to clarify.  If you requested that your care partner not be given the details of your procedure findings, then the procedure report has been included in a sealed envelope for you to review at your convenience later.  YOU SHOULD EXPECT: Some feelings of bloating in the abdomen. Passage of more gas than usual.  Walking can help get rid of the air that was put into your GI tract during the procedure and reduce the bloating. If you had a lower endoscopy (such as a colonoscopy or flexible sigmoidoscopy) you may notice spotting of blood in your stool or on the toilet paper. If you underwent a bowel prep for your procedure, you may not have a normal bowel movement for a few days.  Please Note:  You might notice some irritation and congestion in your nose or some drainage.  This is from the oxygen used during your procedure.  There is no need for concern and it should clear up in a day or so.  SYMPTOMS TO REPORT IMMEDIATELY:  Following lower endoscopy (colonoscopy or flexible sigmoidoscopy):  Excessive amounts of blood in the stool  Significant tenderness or worsening of abdominal pains  Swelling of the abdomen that is new, acute  Fever of 100F or higher    For urgent or emergent issues, a gastroenterologist can be reached at any hour by calling [phone]. Do not use MyChart messaging for urgent concerns.    DIET:  We do recommend a small meal at first, but then you may proceed to your regular diet.  Drink plenty of fluids but you should avoid alcoholic beverages for 24 hours.  ACTIVITY:  You should plan to take it easy  for the rest of today and you should NOT DRIVE or use heavy machinery until tomorrow (because of the sedation medicines used during the test).    FOLLOW UP: Our staff will call the number listed on your records the next business day following your procedure.  We will call around 7:15- 8:00 am to check on you and address any questions or concerns that you may have regarding the information given to you following your procedure. If we do not reach you, we will leave a message.     If any biopsies were taken you will be contacted by phone or by letter within the next 1-3 weeks.  Please call us  at [phone] if you have not heard about the biopsies in 3 weeks.    SIGNATURES/CONFIDENTIALITY: You and/or your care partner have signed paperwork which will be entered into your electronic medical record.  These signatures attest to the fact that that the information above on your After Visit Summary has been reviewed and is understood.  Full responsibility of the confidentiality of this discharge information lies with you and/or your care-partner.

## 2023-11-20 NOTE — Op Note (Signed)
 [facility] Patient Name: Erik West Procedure Date: 11/20/2023 11:43 AM MRN: [id number] Endoscopist: Glendia E. Stacia , MD, [id number] Age: 43 Referring MD:  Date of Birth: 07-20-1980 Gender: Male Account #: [id number] Procedure:                Colonoscopy Indications:              Heme positive stool Medicines:                Monitored Anesthesia Care Procedure:                Pre-Anesthesia Assessment:                           - Prior to the procedure, a History and Physical                            was performed, and patient medications and                            allergies were reviewed. The patient's tolerance of                            previous anesthesia was also reviewed. The risks                            and benefits of the procedure and the sedation                            options and risks were discussed with the patient.                            All questions were answered, and informed consent                            was obtained. Prior Anticoagulants: The patient has                            taken no anticoagulant or antiplatelet agents. ASA                            Grade Assessment: II - A patient with mild systemic                            disease. After reviewing the risks and benefits,                            the patient was deemed in satisfactory condition to                            undergo the procedure.                           After obtaining informed consent, the colonoscope  was passed under direct vision. Throughout the                            procedure, the patient's blood pressure, pulse, and                            oxygen saturations were monitored continuously. The                            Colonoscope was introduced through the anus and                            advanced to the the terminal ileum, with                            identification of the appendiceal orifice and  IC                            valve. The colonoscopy was performed without                            difficulty. The patient tolerated the procedure                            well. The quality of the bowel preparation was                            good. The terminal ileum, ileocecal valve,                            appendiceal orifice, and rectum were photographed.                            The bowel preparation used was SUPREP via split                            dose instruction. Scope In: 11:59:03 AM Scope Out: 12:12:26 PM Scope Withdrawal Time: 0 hours 9 minutes 18 seconds  Total Procedure Duration: 0 hours 13 minutes 23 seconds  Findings:                 The perianal and digital rectal examinations were                            normal. Pertinent negatives include normal                            sphincter tone and no palpable rectal lesions.                           A few small-mouthed diverticula were found in the                            sigmoid colon and descending colon.  The exam was otherwise normal throughout the                            examined colon.                           The terminal ileum appeared normal.                           The retroflexed view of the distal rectum and anal                            verge was normal and showed no anal or rectal                            abnormalities. Complications:            No immediate complications. Estimated Blood Loss:     Estimated blood loss: none. Impression:               - Mild diverticulosis in the sigmoid colon and in                            the descending colon.                           - The examined portion of the ileum was normal.                           - The distal rectum and anal verge are normal on                            retroflexion view.                           - No specimens collected.                           - Suspect FOBT was false  positive. Recommendation:           - Patient has a contact number available for                            emergencies. The signs and symptoms of potential                            delayed complications were discussed with the                            patient. Return to normal activities tomorrow.                            Written discharge instructions were provided to the                            patient.                           -  Resume previous diet.                           - Continue present medications.                           - Repeat colonoscopy in 10 years for screening                            purposes. Aissa Lisowski E. Stacia, MD 11/20/2023 12:19:50 PM This report has been signed electronically.

## 2023-11-20 NOTE — Progress Notes (Signed)
 Pt's states no medical or surgical changes since previsit or office visit.

## 2023-11-20 NOTE — Progress Notes (Signed)
 A/o x 3, VSS, gd SR's, pleased with anesthesia, report to RN

## 2023-11-20 NOTE — Progress Notes (Signed)
 [facility] History and Physical   Primary Care Physician:  Georgina Speaks, FNP   Reason for Procedure:   Positive FOBT  Plan:    Colonoscopy     HPI: Erik West is a 43 y.o. male undergoing colonoscopy following a positive fecal occult blood test.  He reports seeing red colored stools earlier this year which he attributed to beet consumption.  He has no family history of colon cancer and no chronic GI symptoms.    Past Medical History:  Diagnosis Date   Essential hypertension    Hypersomnia    Sleep apnea    diagnosed maybe a year ago uses CPAP   Uncontrolled hypertension 11/05/2022    Past Surgical History:  Procedure Laterality Date   ANTERIOR CERVICAL DECOMPRESSION/DISCECTOMY FUSION 4 LEVELS N/A 07/03/2019   Procedure: ANTERIOR CERVICAL DECOMPRESSION FUSION CERVICAL 4-5, CERVICAL 5-6, CERVICAL 6-7 WITH INSTRUMENTATION AND ALLOGRAFT;  Surgeon: Beuford Anes, MD;  Location: [facility] OR;  Service: Orthopedics;  Laterality: N/A;   CHOLECYSTECTOMY N/A 08/23/2022   Procedure: LAPAROSCOPIC CHOLECYSTECTOMY;  Surgeon: Tanda Locus, MD;  Location: [facility] ORS;  Service: General;  Laterality: N/A;    Prior to Admission medications   Medication Sig Start Date End Date Taking? Authorizing Provider  amLODipine  (NORVASC ) 5 MG tablet Take 1 tablet (5 mg total) by mouth daily. 08/16/23  Yes Georgina Speaks, FNP  testosterone cypionate (DEPOTESTOSTERONE CYPIONATE) 200 MG/ML injection Inject 1.3 mLs into the muscle once a week. 09/13/23  Yes [provider]  valsartan  (DIOVAN ) 160 MG tablet Take 1 tablet (160 mg total) by mouth daily. 08/16/23  Yes Georgina Speaks, FNP  VITAMIN D  PO Take 1 tablet by mouth daily at 6 (six) AM.   Yes [provider]  albuterol  (VENTOLIN  HFA) 108 (90 Base) MCG/ACT inhaler Inhale 2 puffs into the lungs every 6 (six) hours as needed for wheezing or shortness of breath. 08/04/20   Ghumman, Ramandeep, NP  anastrozole (ARIMIDEX) 1 MG tablet Take 1 mg  by mouth once a week. 10/10/23   [provider]  cetirizine  (ZYRTEC ) 10 MG tablet TAKE 1 TABLET BY MOUTH EVERY DAY Patient taking differently: Take 10 mg by mouth daily as needed for allergies or rhinitis. 10/31/19   Moore, Janece, FNP  fluticasone  (FLONASE ) 50 MCG/ACT nasal spray Place 1 spray into both nostrils 2 (two) times daily as needed for rhinitis or allergies. 02/19/18   [provider]  methocarbamol  (ROBAXIN ) 500 MG tablet Take 500 mg by mouth every 6 (six) hours as needed for muscle spasms (or tension).    [provider]  multivitamin (ONE-A-DAY MEN'S) TABS tablet Take 1 tablet by mouth daily with breakfast.    [provider]  TYLENOL  500 MG tablet Take 500-1,000 mg by mouth every 6 (six) hours as needed for mild pain or headache.    [provider]    Current Outpatient Medications  Medication Sig Dispense Refill   amLODipine  (NORVASC ) 5 MG tablet Take 1 tablet (5 mg total) by mouth daily. 90 tablet 1   testosterone cypionate (DEPOTESTOSTERONE CYPIONATE) 200 MG/ML injection Inject 1.3 mLs into the muscle once a week.     valsartan  (DIOVAN ) 160 MG tablet Take 1 tablet (160 mg total) by mouth daily. 90 tablet 1   VITAMIN D  PO Take 1 tablet by mouth daily at 6 (six) AM.     albuterol  (VENTOLIN  HFA) 108 (90 Base) MCG/ACT inhaler Inhale 2 puffs into the lungs every 6 (six) hours as needed for wheezing  or shortness of breath. 8 g 2   anastrozole (ARIMIDEX) 1 MG tablet Take 1 mg by mouth once a week.     cetirizine  (ZYRTEC ) 10 MG tablet TAKE 1 TABLET BY MOUTH EVERY DAY (Patient taking differently: Take 10 mg by mouth daily as needed for allergies or rhinitis.) 90 tablet 0   fluticasone  (FLONASE ) 50 MCG/ACT nasal spray Place 1 spray into both nostrils 2 (two) times daily as needed for rhinitis or allergies.     methocarbamol  (ROBAXIN ) 500 MG tablet Take 500 mg by mouth every 6 (six) hours as needed for muscle spasms (or tension).     multivitamin  (ONE-A-DAY MEN'S) TABS tablet Take 1 tablet by mouth daily with breakfast.     TYLENOL  500 MG tablet Take 500-1,000 mg by mouth every 6 (six) hours as needed for mild pain or headache.     Current Facility-Administered Medications  Medication Dose Route Frequency Provider Last Rate Last Admin   0.9 %  sodium chloride  infusion  500 mL Intravenous Once Stacia Glendia BRAVO, MD        Allergies as of 11/20/2023   (No Known Allergies)    Family History  Problem Relation Age of Onset   Hypertension Paternal Grandmother    Cancer Other    Liver disease Neg Hx    Esophageal cancer Neg Hx     Social History   Socioeconomic History   Marital status: Married    Spouse name: Not on file   Number of children: 3   Years of education: Not on file   Highest education level: Not on file  Occupational History   Not on file  Tobacco Use   Smoking status: Never   Smokeless tobacco: Never  Vaping Use   Vaping status: Never Used  Substance and Sexual Activity   Alcohol  use: Yes    Comment: occ   Drug use: Never   Sexual activity: Yes    Partners: Female    Comment: he is married  Other Topics Concern   Not on file  Social History Narrative   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    Social Drivers of Corporate investment banker Strain: Not on file  Food Insecurity: No Food Insecurity (08/23/2022)   Hunger Vital Sign    Worried About Running Out of Food in the Last Year: Never true    Ran Out of Food in the Last Year: Never true  Transportation Needs: No Transportation Needs (08/23/2022)   PRAPARE - Administrator, Civil Service (Medical): No    Lack of Transportation (Non-Medical): No  Physical Activity: Insufficiently Active (02/19/2018)   Exercise Vital Sign    Days of Exercise per Week: 2 days    Minutes of Exercise per Session: 30 min  Stress: Not on file  Social Connections: Not on file  Intimate Partner Violence: Not At Risk (08/23/2022)   Humiliation,  Afraid, Rape, and Kick questionnaire    Fear of Current or Ex-Partner: No    Emotionally Abused: No    Physically Abused: No    Sexually Abused: No    Review of Systems:  All other review of systems negative except as mentioned in the HPI.  Physical Exam: Vital signs BP (!) 167/96   Pulse 87   Temp 97.8 F (36.6 C)   Ht 5' 11 (1.803 m)   Wt 263 lb (119.3 kg)   SpO2 96%   BMI 36.68 kg/m  General:   Alert,  Well-developed, well-nourished, pleasant and cooperative in NAD Airway:  Mallampati 2 Lungs:  Clear throughout to auscultation.   Heart:  Regular rate and rhythm; no murmurs, clicks, rubs,  or gallops. Abdomen:  Soft, nontender and nondistended. Normal bowel sounds.   Neuro/Psych:  Normal mood and affect. A and O x 3   Marquitta Persichetti E. Stacia, MD [facility]

## 2023-11-21 ENCOUNTER — Telehealth: Payer: Self-pay | Admitting: *Deleted

## 2023-11-21 NOTE — Telephone Encounter (Signed)
 Attempted to call patient for their post-procedure follow-up call. No answer. Unable to leave voicemail due to voicemail being full. If you have any questions or concerns following your procedure please give the [facility] a call.

## 2023-11-22 ENCOUNTER — Other Ambulatory Visit (HOSPITAL_BASED_OUTPATIENT_CLINIC_OR_DEPARTMENT_OTHER)

## 2023-12-06 ENCOUNTER — Ambulatory Visit (HOSPITAL_COMMUNITY)
Admission: RE | Admit: 2023-12-06 | Discharge: 2023-12-06 | Disposition: A | Discharge status: Home / Self Care | Payer: Self-pay | Source: Ambulatory Visit | Attending: Cardiology | Admitting: Cardiology

## 2023-12-06 ENCOUNTER — Ambulatory Visit: Payer: Self-pay | Admitting: Cardiovascular Disease

## 2023-12-06 DIAGNOSIS — E7800 Pure hypercholesterolemia, unspecified: Secondary | ICD-10-CM | POA: Insufficient documentation

## 2023-12-24 ENCOUNTER — Encounter: Payer: Self-pay | Admitting: Pharmacist

## 2023-12-24 ENCOUNTER — Other Ambulatory Visit (HOSPITAL_COMMUNITY): Payer: Self-pay

## 2023-12-24 ENCOUNTER — Ambulatory Visit: Attending: Internal Medicine | Admitting: Pharmacist

## 2023-12-24 VITALS — BP 151/94 | HR 80

## 2023-12-24 DIAGNOSIS — E291 Testicular hypofunction: Secondary | ICD-10-CM | POA: Insufficient documentation

## 2023-12-24 DIAGNOSIS — J302 Other seasonal allergic rhinitis: Secondary | ICD-10-CM | POA: Insufficient documentation

## 2023-12-24 DIAGNOSIS — R7303 Prediabetes: Secondary | ICD-10-CM | POA: Insufficient documentation

## 2023-12-24 DIAGNOSIS — E611 Iron deficiency: Secondary | ICD-10-CM | POA: Insufficient documentation

## 2023-12-24 DIAGNOSIS — E7800 Pure hypercholesterolemia, unspecified: Secondary | ICD-10-CM | POA: Diagnosis not present

## 2023-12-24 DIAGNOSIS — R61 Generalized hyperhidrosis: Secondary | ICD-10-CM | POA: Insufficient documentation

## 2023-12-24 DIAGNOSIS — E786 Lipoprotein deficiency: Secondary | ICD-10-CM | POA: Insufficient documentation

## 2023-12-24 DIAGNOSIS — I10 Essential (primary) hypertension: Secondary | ICD-10-CM | POA: Diagnosis not present

## 2023-12-24 DIAGNOSIS — E559 Vitamin D deficiency, unspecified: Secondary | ICD-10-CM | POA: Insufficient documentation

## 2023-12-24 DIAGNOSIS — R454 Irritability and anger: Secondary | ICD-10-CM | POA: Insufficient documentation

## 2023-12-24 DIAGNOSIS — Z9989 Dependence on other enabling machines and devices: Secondary | ICD-10-CM | POA: Insufficient documentation

## 2023-12-24 DIAGNOSIS — M2550 Pain in unspecified joint: Secondary | ICD-10-CM | POA: Insufficient documentation

## 2023-12-24 DIAGNOSIS — Z79890 Hormone replacement therapy (postmenopausal): Secondary | ICD-10-CM | POA: Insufficient documentation

## 2023-12-24 MED ORDER — ATORVASTATIN CALCIUM 40 MG PO TABS
40.0000 mg | ORAL_TABLET | Freq: Every day | ORAL | 1 refills | Status: AC
  Filled 2023-12-24: qty 30, 30d supply, fill #0
  Filled 2023-12-24: qty 90, 90d supply, fill #0
  Filled 2024-02-12: qty 30, 30d supply, fill #1
  Filled 2024-04-08: qty 30, 30d supply, fill #2

## 2023-12-24 MED ORDER — AMLODIPINE BESYLATE 10 MG PO TABS
10.0000 mg | ORAL_TABLET | Freq: Every day | ORAL | 1 refills | Status: AC
  Filled 2023-12-24: qty 90, 90d supply, fill #0
  Filled 2023-12-24: qty 30, 30d supply, fill #0
  Filled 2024-02-12: qty 30, 30d supply, fill #1
  Filled 2024-04-08: qty 30, 30d supply, fill #2

## 2023-12-24 NOTE — Patient Instructions (Addendum)
 It was nice meeting you today  We would like your blood pressure to be less than 130/80  We will increase your amlodipine  to 10mg  once daily in the evening  Continue your valsartan  160mg  in the morning  I have also filled your atorvastatin  40mg  for you  Continue to monitor your blood pressure at home  We will set you up an appointment to see Dr Shlomo for sleep apnea  Medford Bolk, PharmD, BCACP, CDCES, CPP [facility] [address] [place], [place] [address] Phone: [phone]; Fax: [phone] 12/24/2023 3:43 PM

## 2023-12-24 NOTE — Progress Notes (Signed)
 Patient ID: Erik West                 DOB: 07/02/1980                      MRN: [id number]     HPI: Erik West is a 43 y.o. male referred by Dr. Court to HTN clinic. PMH is significant for HTN, OSA, obesity, and HLD.  Patient presents today with wife to discuss blood pressure treatment. Currently managed on valsartan  160mg  in the morning and amlodipine  5mg  in the evening. Reports no adverse effects.  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 They do not add salt to foods and have switched to Zero drinks. Does not drink coffee.  Went to gym twice last week.  Reports he has not been sleeping well since his CPAP mask broke.   Current HTN meds:  Amlodipine  5mg  daily night Valsartan  160mg  daily morning  BP goal: <130/80    Wt Readings from Last 3 Encounters:  11/20/23 263 lb (119.3 kg)  11/06/23 256 lb (116.1 kg)  10/04/23 263 lb (119.3 kg)   BP Readings from Last 3 Encounters:  12/24/23 (!) 151/94  11/20/23 (!) 166/95  11/06/23 130/80   Pulse Readings from Last 3 Encounters:  12/24/23 80  11/20/23 88  11/06/23 91    Renal function: CrCl cannot be calculated (Patient's most recent lab result is older than the maximum 21 days allowed.).  Past Medical History:  Diagnosis Date   Essential hypertension    Hypersomnia    Sleep apnea    diagnosed maybe a year ago uses CPAP   Uncontrolled hypertension 11/05/2022    Current Outpatient Medications on File Prior to Visit  Medication Sig Dispense Refill   anastrozole (ARIMIDEX) 1 MG tablet Take 1 mg by mouth once a week.     cetirizine  (ZYRTEC ) 10 MG tablet TAKE 1 TABLET BY MOUTH EVERY DAY (Patient taking differently: Take 10 mg by mouth daily as needed for allergies or rhinitis.) 90 tablet 0   fluticasone  (FLONASE ) 50 MCG/ACT nasal spray Place 1 spray into both nostrils 2 (two) times daily as needed for rhinitis or  allergies.     methocarbamol  (ROBAXIN ) 500 MG tablet Take 500 mg by mouth every 6 (six) hours as needed for muscle spasms (or tension).     multivitamin (ONE-A-DAY MEN'S) TABS tablet Take 1 tablet by mouth daily with breakfast.     testosterone cypionate (DEPOTESTOSTERONE CYPIONATE) 200 MG/ML injection Inject 1.3 mLs into the muscle once a week.     TYLENOL  500 MG tablet Take 500-1,000 mg by mouth every 6 (six) hours as needed for mild pain or headache.     valsartan  (DIOVAN ) 160 MG tablet Take 1 tablet (160 mg total) by mouth daily. 90 tablet 1   VITAMIN D  PO Take 1 tablet by mouth daily at 6 (six) AM.     Current Facility-Administered Medications on File Prior to Visit  Medication Dose Route Frequency Provider Last Rate Last Admin   0.9 %  sodium chloride  infusion  500 mL Intravenous Once Cunningham, Scott E, MD        No Known Allergies   Assessment/Plan:  1. Hypertension -  HYPERTENSION CONTROL Vitals:   12/24/23 1526 12/24/23 1532  BP: (!) 156/97 (!) 151/94    The patient's blood pressure is elevated above target today.  In order  to address the patient's elevated BP: A current anti-hypertensive medication was adjusted today.    Patient BP in room today 151/94 which is above goal of <130/80.  CPAP mask has broken resulting in him having poor sleep which may be contributing. Will have sleep team reach out to patient and see if he can be established with Dr Shlomo.  Will increase amlodipine  to 10mg  once daily and continue valsartan  160mg  once daily. Has follow up scheduled with Damien Braver NP next month. Will follow up afterwards as needed.  Increase amlodipine  to 10mg  daily Continue valsartan  160mg  daily Referral to sleep in progress F/u with Damien Braver in 4 weeks Recheck as needed  Medford Bolk, PharmD, [place], CDCES, CPP [facility] [address] [place], [place] [address] Phone: [phone]; Fax: [phone] 12/24/2023 4:12 PM

## 2023-12-25 ENCOUNTER — Encounter: Payer: Self-pay | Admitting: Pharmacist

## 2023-12-25 NOTE — Telephone Encounter (Signed)
**Note De-Identified Kalani Baray Obfuscation** The pt is aware that I have left a mask in the front office for him to come by and pick up. Hopefully this will help him with his CPAP machine until he is seen in sleep clinic.

## 2024-01-08 ENCOUNTER — Ambulatory Visit: Attending: Cardiology | Admitting: Cardiology

## 2024-01-08 ENCOUNTER — Encounter: Payer: Self-pay | Admitting: Cardiology

## 2024-01-08 VITALS — BP 130/88 | HR 90 | Ht 71.0 in | Wt 254.6 lb

## 2024-01-08 DIAGNOSIS — G4733 Obstructive sleep apnea (adult) (pediatric): Secondary | ICD-10-CM | POA: Diagnosis not present

## 2024-01-08 NOTE — Progress Notes (Signed)
 Sleep Medicine CONSULT Note    Date:  01/08/2024   ID:  Erik West, DOB 01/01/1981, MRN [id number]  PCP:  Georgina Speaks, FNP  Cardiologist: Dorn Lesches, MD   Chief Complaint  Patient presents with   New Patient (Initial Visit)    Obstructive sleep apnea    History of Present Illness:  Erik West is a 43 y.o. male who is being seen today for the evaluation of obstructive sleep apnea at the request of Dorn Lesches, MD.  This is a 43 year old obese African-American male with a history of hypertension and hyperlipidemia.  He has a history of obstructive sleep apnea and has been on CPAP therapy but has not been followed by a sleep physician.  He had a home sleep study on 05/14/2018 with a WatchPAT device showing severe obstructive sleep apnea with an AHI of 87.6/h, RDI 87.8/h and O2 saturation nadir 59%.  He spent 51.8 minutes with O2 saturations less than 80%.  He underwent CPAP titration 01/10/2019 ongoing respiratory events was changed to BiPAP and started on BiPAP at 19/15 cm H2O with a large fullface mask.  He had been followed with neurology but after getting his BiPAP unit he failed to follow back up.  He is now for referred for sleep medicine consultation to establish treatment and care of his sleep apnea.  He is doing well with his PAP device and thinks that he has gotten used to it.  He tolerates the full face mask and feels the pressure is adequate.  He feels rested in the am and has no significant daytime sleepiness if he has used his device the night before and slept well.  He denies any significant mouth or nasal dryness or nasal congestion.  He does not think that he snores. Patient denies any episodes of bruxism, restless legs, No cataplectic events.  His wife says that he does act out dreams swinging his arms and standing up in the room.  Past Medical History:  Diagnosis Date   Essential hypertension    Hypersomnia    Sleep apnea    diagnosed maybe a year  ago uses CPAP   Uncontrolled hypertension 11/05/2022    Past Surgical History:  Procedure Laterality Date   ANTERIOR CERVICAL DECOMPRESSION/DISCECTOMY FUSION 4 LEVELS N/A 07/03/2019   Procedure: ANTERIOR CERVICAL DECOMPRESSION FUSION CERVICAL 4-5, CERVICAL 5-6, CERVICAL 6-7 WITH INSTRUMENTATION AND ALLOGRAFT;  Surgeon: Beuford Anes, MD;  Location: [facility] OR;  Service: Orthopedics;  Laterality: N/A;   CHOLECYSTECTOMY N/A 08/23/2022   Procedure: LAPAROSCOPIC CHOLECYSTECTOMY;  Surgeon: Tanda Locus, MD;  Location: [facility];  Service: General;  Laterality: N/A;    Current Medications: Current Meds  Medication Sig   amLODipine  (NORVASC ) 10 MG tablet Take 1 tablet (10 mg total) by mouth daily.   anastrozole (ARIMIDEX) 1 MG tablet Take 1 mg by mouth once a week.   atorvastatin  (LIPITOR) 40 MG tablet Take 1 tablet (40 mg total) by mouth daily.   cetirizine  (ZYRTEC ) 10 MG tablet TAKE 1 TABLET BY MOUTH EVERY DAY (Patient taking differently: Take 10 mg by mouth daily as needed for allergies or rhinitis.)   fluticasone  (FLONASE ) 50 MCG/ACT nasal spray Place 1 spray into both nostrils 2 (two) times daily as needed for rhinitis or allergies.   methocarbamol  (ROBAXIN ) 500 MG tablet Take 500 mg by mouth every 6 (six) hours as needed for muscle spasms (or tension).   multivitamin (ONE-A-DAY MEN'S) TABS tablet Take 1 tablet by mouth daily with  breakfast.   testosterone cypionate (DEPOTESTOSTERONE CYPIONATE) 200 MG/ML injection Inject 1.3 mLs into the muscle once a week.   TYLENOL  500 MG tablet Take 500-1,000 mg by mouth every 6 (six) hours as needed for mild pain or headache.   valsartan  (DIOVAN ) 160 MG tablet Take 1 tablet (160 mg total) by mouth daily.   VITAMIN D  PO Take 1 tablet by mouth daily at 6 (six) AM.   Current Facility-Administered Medications for the 01/08/24 encounter (Office Visit) with Erik Wilbert SAUNDERS, MD  Medication   0.9 %  sodium chloride  infusion    Allergies:   Patient has no known  allergies.   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    Social Drivers of Corporate investment banker Strain: Not on file  Food Insecurity: No Food Insecurity (08/23/2022)   Hunger Vital Sign    Worried About Running Out of Food in the Last Year: Never true    Ran Out of Food in the Last Year: Never true  Transportation Needs: No Transportation Needs (08/23/2022)   PRAPARE - Administrator, Civil Service (Medical): No    Lack of Transportation (Non-Medical): No  Physical Activity: Insufficiently Active (02/19/2018)   Exercise Vital Sign    Days of Exercise per Week: 2 days    Minutes of Exercise per Session: 30 min  Stress: Not on file  Social Connections: Not on file     Family History:  The patient's family history includes Cancer in an other family member; Hypertension in his paternal grandmother.   ROS:   Please see the history of present illness.    ROS All other systems reviewed and are negative.     01/07/2024    4:28 PM 11/06/2023    2:01 PM  PAD Screen  Previous PAD dx? No No  Previous surgical procedure? Yes Yes  Dates of procedures 08/2022 09/2022 gallbladder removal  Pain with walking? No No  Subsides with rest?  No  Feet/toe relief with dangling? No No  Painful, non-healing ulcers? No No  Extremities discolored? No No       PHYSICAL EXAM:   VS:  BP 130/88   Pulse 90   Ht 5' 11  (1.803 m)   Wt 254 lb 9.6 oz (115.5 kg)   SpO2 97%   BMI 35.51 kg/m    GEN: Well nourished, well developed, in no acute distress  HEENT: normal  Neck: no JVD, carotid bruits, or masses Cardiac: RRR; no murmurs, rubs, or gallops,no edema.  Intact distal pulses bilaterally.  Respiratory:  clear to auscultation bilaterally, normal work of breathing GI: soft, nontender, nondistended, + BS MS: no deformity or atrophy  Skin: warm and dry, no rash Neuro:  Alert and Oriented x  3, Strength and sensation are intact Psych: euthymic mood, full affect  Wt Readings from Last 3 Encounters:  01/08/24 254 lb 9.6 oz (115.5 kg)  11/20/23 263 lb (119.3 kg)  11/06/23 256 lb (116.1 kg)      Studies/Labs Reviewed:   Home sleep study 2020, CPAP titration 2020  Recent Labs: 01/16/2023: ALT 25; BUN 24; Creatinine, Ser 1.43; Potassium 4.0; Sodium 138 05/17/2023: Hemoglobin 15.0; Platelets 249     ASSESSMENT:    1. OSA (obstructive sleep apnea)      PLAN:  In order of problems listed above:  OSA - The patient is tolerating PAP therapy well without any problems.  The patient has been using and benefiting from PAP use and will continue to benefit from therapy.  - He uses [employer] his DME but says he has not gotten supplies in a very long time - I will place an order for CPAP supplies and also ask [employer] to get him set up on [employer] so we can get downloads of his device. - he is due for a new device on 01/10/2024.  Since it has been over 5 years since he had his last sleep study will start out with a split night sleep study and get back on BiPAP -His wife complains that he is very active in his sleep and will act in his sleep with his arms thrashing and combative.  Recommend in lab study to evaluate this -his wife is concerned that he has narcolepsy so I recommended that we get him adequately titrated on PAP therapy and if sx persist then we can test with MSLT   Time Spent: 20 minutes total time of  encounter, including 15 minutes spent in face-to-face patient care on the date of this encounter. This time includes coordination of care and counseling regarding above mentioned problem list. Remainder of non-face-to-face time involved reviewing chart documents/testing relevant to the patient encounter and documentation in the medical record. I have independently reviewed documentation from referring provider  Medication Adjustments/Labs and Tests Ordered: Current medicines are reviewed at length with the patient today.  Concerns regarding medicines are outlined above.  Medication changes, Labs and Tests ordered today are listed in the Patient Instructions below.  There are no Patient Instructions on file for this visit.   Signed, Wilbert Bihari, MD  01/08/2024 2:36 PM    [facility] [address] [place], [place], [place]  [address] Phone: [phone]; Fax: [phone]

## 2024-01-08 NOTE — Patient Instructions (Signed)
 Medication Instructions:  Your physician recommends that you continue on your current medications as directed. Please refer to the Current Medication list given to you today.  *If you need a refill on your cardiac medications before your next appointment, please call your pharmacy*  Lab Work: none If you have labs (blood work) drawn today and your tests are completely normal, you will receive your results only by: MyChart Message (if you have MyChart) OR A paper copy in the mail If you have any lab test that is abnormal or we need to change your treatment, we will call you to review the results.  Testing/Procedures: none  Follow-Up: At [facility], you and your health needs are our priority.  As part of our continuing mission to provide you with exceptional heart care, our providers are all part of one team.  This team includes your primary Cardiologist (physician) and Advanced Practice Providers or APPs (Physician Assistants and Nurse Practitioners) who all work together to provide you with the care you need, when you need it.  Your next appointment:   To be determined  Provider:   Dr Shlomo   We recommend signing up for the patient portal called MyChart.  Sign up information is provided on this After Visit Summary.  MyChart is used to connect with patients for Virtual Visits (Telemedicine).  Patients are able to view lab/test results, encounter notes, upcoming appointments, etc.  Non-urgent messages can be sent to your provider as well.   To learn more about what you can do with MyChart, go to [email or web address].   Other Instructions

## 2024-01-11 ENCOUNTER — Telehealth: Payer: Self-pay | Admitting: *Deleted

## 2024-01-11 DIAGNOSIS — I10 Essential (primary) hypertension: Secondary | ICD-10-CM

## 2024-01-11 DIAGNOSIS — G4733 Obstructive sleep apnea (adult) (pediatric): Secondary | ICD-10-CM

## 2024-01-11 NOTE — Telephone Encounter (Signed)
 Order placed to [facility] to Order new PAP supplies and a new split night sleep study as he is due for new device.

## 2024-01-11 NOTE — Telephone Encounter (Signed)
-----   Message from Wilbert Bihari sent at 01/08/2024  2:53 PM EDT ----- Order new PAP supplies and a new split night sleep study as he is due for new device

## 2024-01-14 ENCOUNTER — Encounter: Payer: Self-pay | Admitting: Cardiology

## 2024-01-14 NOTE — Telephone Encounter (Signed)
 Prior Authorization for SPLIT NIGHT sent to [facility] via web portal. Tracking Number . Order ID: [id number] Authorized--Approval Valid Through:01/14/2024 - 03/13/2024

## 2024-02-05 ENCOUNTER — Ambulatory Visit: Admitting: Nurse Practitioner

## 2024-02-07 ENCOUNTER — Encounter: Payer: Self-pay | Admitting: Nurse Practitioner

## 2024-02-20 ENCOUNTER — Ambulatory Visit: Admitting: Nurse Practitioner

## 2024-02-25 ENCOUNTER — Encounter: Payer: Self-pay | Admitting: Pharmacist

## 2024-02-25 ENCOUNTER — Other Ambulatory Visit (HOSPITAL_COMMUNITY): Payer: Self-pay

## 2024-02-25 DIAGNOSIS — I10 Essential (primary) hypertension: Secondary | ICD-10-CM

## 2024-02-25 MED ORDER — VALSARTAN 160 MG PO TABS
160.0000 mg | ORAL_TABLET | Freq: Every day | ORAL | 1 refills | Status: DC
  Filled 2024-02-25: qty 90, 90d supply, fill #0

## 2024-03-02 ENCOUNTER — Ambulatory Visit (HOSPITAL_BASED_OUTPATIENT_CLINIC_OR_DEPARTMENT_OTHER): Attending: Cardiology | Admitting: Cardiology

## 2024-03-02 DIAGNOSIS — G4736 Sleep related hypoventilation in conditions classified elsewhere: Secondary | ICD-10-CM | POA: Diagnosis not present

## 2024-03-02 DIAGNOSIS — I10 Essential (primary) hypertension: Secondary | ICD-10-CM

## 2024-03-02 DIAGNOSIS — G4733 Obstructive sleep apnea (adult) (pediatric): Secondary | ICD-10-CM | POA: Insufficient documentation

## 2024-03-18 NOTE — Progress Notes (Unsigned)
 Cardiology Office Note    Date:  03/18/2024  ID:  Akim Watkinson, DOB [birth year], MRN [id number] PCP:  Georgina Speaks, FNP  Cardiologist:  Dorn Lesches, MD  Electrophysiologist:  None   Chief Complaint: ***  History of Present Illness: .    Tawan Degroote is a 43 y.o. male with visit-pertinent history of hypertension, OSA and hyperlipidemia.  Patient was initially evaluated by Dr. Lesches on 11/06/2023 at the referral of his PCP for hypertension.  It was noted the patient did not smoke, had no family history of heart disease.  He had never had a heart attack or stroke.  It was noted the patient had hypertension for multiple years on medications.  He denied any anginal symptoms.  Patient's blood pressure was well-controlled at office visit.  Recommended following up with Pharm.D. after 2-week blood pressure log.  Coronary calcium  score was ordered for further stratification.  Coronary calcium  score on 12/06/2023 indicated coronary calcium  score 16.5, 85th percentile for age, race and sex matched control.  It was recommended that patient start on atorvastatin  40 mg daily with recheck fasting lipid profile in 3 months.  LDL goal less than 70.  Patient was seen by Pharm.D. on 12/24/2023, blood pressure was elevated at office visit.  Amlodipine  was increased to 10 mg daily.  Today he presents for follow-up.  He reports that he  Coronary calcium : Coronary calcium  score on 12/06/2023 indicated coronary calcium  score of 16.5, 85th percentile for age, race and sex matched control.  HTN: Blood pressure today   OSA: Now followed by Dr. Shlomo.  Hyperlipidemia: Last lipid profile indicated  Labwork independently reviewed:   ROS: .   *** denies chest pain, shortness of breath, lower extremity edema, fatigue, palpitations, melena, hematuria, hemoptysis, diaphoresis, weakness, presyncope, syncope, orthopnea, and PND.  All other systems are reviewed and otherwise negative.  Studies Reviewed: SABRA     EKG:  EKG is ordered today, personally reviewed, demonstrating ***     CV Studies: Cardiac studies reviewed are outlined and summarized above. Otherwise please see EMR for full report. Cardiac Studies & Procedures   ______________________________________________________________________________________________          CT SCANS  CT CARDIAC SCORING (SELF PAY ONLY) 12/06/2023  Addendum 12/06/2023  6:56 PM ADDENDUM REPORT: 12/06/2023 18:54  EXAM: OVER-READ INTERPRETATION  CT CHEST  The following report is an over-read performed by radiologist Dr. Fonda Mom [facility] on 12/06/2023. This over-read does not include interpretation of cardiac or coronary anatomy or pathology. The coronary CTA interpretation by the cardiologist is attached.  COMPARISON:  None.  FINDINGS: Cardiovascular:  See findings discussed in the body of the report.  Mediastinum/Nodes: No suspicious adenopathy identified. Imaged mediastinal structures are unremarkable.  Lungs/Pleura: Imaged lungs are clear. No pleural effusion or pneumothorax.  Upper Abdomen: Hepatic hypodensity consistent with a 2 cm cyst.  Musculoskeletal: Mild bilateral gynecomastia. No acute osseous findings.  IMPRESSION: Mild gynecomastia. Hepatic cyst. Otherwise unremarkable over-read.   Electronically Signed By: Fonda Field M.D. On: 12/06/2023 18:54  Narrative CLINICAL DATA:  Cardiovascular Disease Risk stratification  EXAM: Coronary Calcium  Score  TECHNIQUE: A gated, non-contrast computed tomography scan of the heart was performed using 3mm slice thickness. Axial images were analyzed on a dedicated workstation. Calcium  scoring of the coronary arteries was performed using the Agatston method.  MEDICATIONS: MEDICATIONS None  FINDINGS: Coronary arteries: Normal origins.  Coronary Calcium  Score:  Left main: 0  Left anterior descending artery: 0  Left circumflex  artery:  0.509  Right coronary artery: 16  Total: 16.5  Percentile: 85th  Pericardium: Normal.  Ascending Aorta: Normal caliber.  Non-cardiac: See separate report from [facility].  IMPRESSION: Coronary calcium  score of 16.5 Agatston units. This was 85th percentile for age-, race-, and sex-matched controls.  RECOMMENDATIONS: Coronary artery calcium  (CAC) score is a strong predictor of incident coronary heart disease (CHD) and provides predictive information beyond traditional risk factors. CAC scoring is reasonable to use in the decision to withhold, postpone, or initiate statin therapy in intermediate-risk or selected borderline-risk asymptomatic adults (age 83-75 years and LDL-C >=70 to <190 mg/dL) who do not have diabetes or established atherosclerotic cardiovascular disease (ASCVD).* In intermediate-risk (10-year ASCVD risk >=7.5% to <20%) adults or selected borderline-risk (10-year ASCVD risk >=5% to <7.5%) adults in whom a CAC score is measured for the purpose of making a treatment decision the following recommendations have been made:  If CAC=0, it is reasonable to withhold statin therapy and reassess in 5 to 10 years, as long as higher risk conditions are absent (diabetes mellitus, family history of premature CHD in first degree relatives (males <55 years; females <65 years), cigarette smoking, or LDL >=190 mg/dL).  If CAC is 1 to 99, it is reasonable to initiate statin therapy for patients >=61 years of age.  If CAC is >=100 or >=75th percentile, it is reasonable to initiate statin therapy at any age.  Cardiology referral should be considered for patients with CAC scores >=400 or >=75th percentile.  *2018 AHA/ACC/AACVPR/AAPA/ABC/ACPM/ADA/AGS/APhA/ASPC/NLA/PCNA Guideline on the Management of Blood Cholesterol: A Report of the American College of Cardiology/American Heart Association Task Force on Clinical Practice Guidelines. J Am Coll  Cardiol. 2019;73(24):3168-3209.  Ezra Shuck, MD  Electronically Signed: By: Ezra Shuck M.D. On: 12/06/2023 18:11     ______________________________________________________________________________________________       Current Reported Medications:.    No outpatient medications have been marked as taking for the 03/20/24 encounter (Appointment) with Brylan Seubert D, NP.   Current Facility-Administered Medications for the 03/20/24 encounter (Appointment) with Kwamaine Cuppett D, NP  Medication   0.9 %  sodium chloride  infusion    Physical Exam:    VS:  There were no vitals taken for this visit.   Wt Readings from Last 3 Encounters:  03/02/24 248 lb (112.5 kg)  01/08/24 254 lb 9.6 oz (115.5 kg)  11/20/23 263 lb (119.3 kg)    GEN: Well nourished, well developed in no acute distress NECK: No JVD; No carotid bruits CARDIAC: ***RRR, no murmurs, rubs, gallops RESPIRATORY:  Clear to auscultation without rales, wheezing or rhonchi  ABDOMEN: Soft, non-tender, non-distended EXTREMITIES:  No edema; No acute deformity     Asessement and Plan:.     ***     Disposition: F/u with ***  Signed, Kelby Adell D Brason Berthelot, NP

## 2024-03-20 ENCOUNTER — Encounter: Payer: Self-pay | Admitting: Cardiology

## 2024-03-20 ENCOUNTER — Other Ambulatory Visit (HOSPITAL_COMMUNITY): Payer: Self-pay

## 2024-03-20 ENCOUNTER — Ambulatory Visit: Attending: Internal Medicine | Admitting: Cardiology

## 2024-03-20 VITALS — BP 140/86 | HR 82 | Ht 71.0 in | Wt 253.8 lb

## 2024-03-20 DIAGNOSIS — I10 Essential (primary) hypertension: Secondary | ICD-10-CM | POA: Diagnosis not present

## 2024-03-20 DIAGNOSIS — G4733 Obstructive sleep apnea (adult) (pediatric): Secondary | ICD-10-CM

## 2024-03-20 DIAGNOSIS — Z79899 Other long term (current) drug therapy: Secondary | ICD-10-CM

## 2024-03-20 DIAGNOSIS — E782 Mixed hyperlipidemia: Secondary | ICD-10-CM

## 2024-03-20 DIAGNOSIS — R931 Abnormal findings on diagnostic imaging of heart and coronary circulation: Secondary | ICD-10-CM

## 2024-03-20 LAB — BASIC METABOLIC PANEL WITH GFR
BUN/Creatinine Ratio: 12 (ref 9–20)
BUN: 14 mg/dL (ref 6–24)
CO2: 25 mmol/L (ref 20–29)
Calcium: 9.8 mg/dL (ref 8.7–10.2)
Chloride: 102 mmol/L (ref 96–106)
Creatinine, Ser: 1.15 mg/dL (ref 0.76–1.27)
Glucose: 77 mg/dL (ref 70–99)
Potassium: 4.4 mmol/L (ref 3.5–5.2)
Sodium: 140 mmol/L (ref 134–144)
eGFR: 81 mL/min/1.73 (ref >59)

## 2024-03-20 MED ORDER — VALSARTAN 320 MG PO TABS
320.0000 mg | ORAL_TABLET | Freq: Every day | ORAL | 11 refills | Status: AC
  Filled 2024-03-20 – 2024-04-08 (×2): qty 30, 30d supply, fill #0

## 2024-03-20 NOTE — Patient Instructions (Addendum)
 Medication Instructions:  INCREASE: Valsartan  to 320 mg (1 Tablet) daily.  *If you need a refill on your cardiac medications before your next appointment, please call your pharmacy*  Lab Work: TODAY: BMET  In 2 Weeks: CMET, Fasting Lipids  If you have labs (blood work) drawn today and your tests are completely normal, you will receive your results only by: MyChart Message (if you have MyChart) OR A paper copy in the mail If you have any lab test that is abnormal or we need to change your treatment, we will call you to review the results.  Testing/Procedures: NONE  Follow-Up: At [facility], you and your health needs are our priority.  As part of our continuing mission to provide you with exceptional heart care, our providers are all part of one team.  This team includes your primary Cardiologist (physician) and Advanced Practice Providers or APPs (Physician Assistants and Nurse Practitioners) who all work together to provide you with the care you need, when you need it.  Your next appointment:   6 week  Provider:   Katlyn West, NP

## 2024-03-21 ENCOUNTER — Ambulatory Visit: Payer: Self-pay | Admitting: Cardiology

## 2024-03-31 ENCOUNTER — Other Ambulatory Visit (HOSPITAL_COMMUNITY): Payer: Self-pay

## 2024-04-06 NOTE — Procedures (Signed)
 " Indications for Polysomnography The patient is a 43 year old Male who is 5' 11 and weighs 248.0 lbs.  His BMI equals 34.7.  A diagnostic polysomnogram was performed to evaluate for Obstructive Sleep Apnea.  After 124.0 minutes of sleep time the patient exhibited sufficient respiratory  events qualifying him for a CPAP trial which was then initiated.  MedicationNo Data. Polysomnogram Data A full night polysomnogram was performed recording the standard physiologic parameters including EEG, EOG, EMG, EKG, nasal and oral airflow.  Respiratory parameters of chest and abdominal movements are recorded with [place]-Crystal motion transducers.   Oxygen saturation was recorded by pulse oximetry.  Sleep Architecture The total recording time of the diagnostic portion of the study was 157.0 minutes.  The total sleep time was 124.0 minutes.  During the diagnostic portion of the study, the patient spent 19.8% of total sleep time in Stage N1, 63.7% in Stage N2, 0.0% in  Stages N3, and 16.5% in REM.   Sleep latency was 9.0 minutes.  REM latency was 69.5 minutes.  Sleep Efficiency was 79.0%.  Wake after Sleep Onset time was 24.0 minutes.  At 12:19:43 AM the patient was placed on PAP treatment and was titrated at pressures ranging from 5 cm/H20 up to 22/18 cm/H20. The total recording time of the treatment portion of the study was 308.1 minutes.  The total sleep time was 286.5 minutes.   During the treatment portion of the study, the patient spent 14.3% of total sleep time in Stage N1, 56.0% in Stage N2, 0.0% in Stages N3, and 29.7% in REM.   Sleep latency was 2.0 minutes.  REM latency was 76.0 minutes.  Sleep Efficiency was 93.0%.  Wake  after Sleep Onset time was 19.5 minutes.  Respiratory Events During the diagnostic portion of the study, the polysomnogram revealed a presence of 0 obstructive, 0 central, and 0 mixed apneas resulting in an Apnea index of 0 events per hour.  There were 175 hypopneas (GreaterEqual  to3% desaturation and/or arousal)  resulting in an Apnea\Hypopnea Index (AHI GreaterEqual to3% desaturation and/or arousal) of 84.7 events per hour.  There were 170 hypopneas (GreaterEqual to4% desaturation) resulting in an Apnea\Hypopnea Index (AHI GreaterEqual to4% desaturation) of 82.3  events per hour.  There were 0 Respiratory Effort Related Arousals resulting in a RERA index of 0 events per hour. The Respiratory Disturbance Index is 84.7 events per hour.  The snore index was 0 events per hour.  Mean oxygen saturation was 84.0%.  The  lowest oxygen saturation during sleep was 55.0%.  Time spent LessEqual to88% oxygen saturation was  minutes ().  During the treatment portion of the study, the polysomnogram revealed a presence of 93 obstructive, 1 central, and 0 mixed apneas resulting in an Apnea index of 19.7 events per hour.  There were 29 hypopneas (GreaterEqual to3% desaturation and/or  arousal) resulting in an Apnea\Hypopnea Index (AHI GreaterEqual to3% desaturation and/or arousal) of 25.8 events per hour.  There were 23 hypopneas (GreaterEqual to4% desaturation) resulting in an Apnea\Hypopnea Index (AHI GreaterEqual to4% desaturation)  of 24.5 events per hour.  There were 10 Respiratory Effort Related Arousals resulting in a RERA index of 2.1 events per hour. The Respiratory Disturbance Index is 27.9 events per hour.  The snore index was 0 events per hour.  Mean oxygen saturation was  92.9%.  The lowest oxygen saturation during sleep was 67.0%.  Time spent LessEqual to88% oxygen saturation was  minutes ().  Limb Activity During the diagnostic portion of the study,  there were 0 limb movements recorded.  During the treatment portion of the study, there were 0 limb movements recorded.   Cardiac Summary During the diagnostic portion of the study, the average pulse rate was 73.5 bpm.  The minimum pulse rate was 57.0 bpm while the maximum pulse rate was 103.0 bpm. NSR with frequent  PACs.  During the treatment portion of the study, the average pulse rate was 73.6 bpm.  The minimum pulse rate was 58.0 bpm while the maximum pulse rate was 101.0 bpm.  With frequent PACs.   Diagnosis: Severe Obstructive Sleep Apnea Nocturnal Hypoxemia  Recommendations: 1. Recommend a trial of ResMed BiPAP at 22/18cm H2O with heated humidity and large [employer] FFM. 2. Close follow-up is necessary to ensure success with CPAP or oral appliance therapy for maximum benefit. 3. A follow-up oximetry study on CPAP is recommended to assess the adequacy of therapy and determine the need for supplemental oxygen or the potential need for Bi-level therapy.  An arterial blood gas to determine the adequacy of baseline ventilation and  oxygenation should also be considered. 4. Healthy sleep recommendations include:  adequate nightly sleep (normal 7-9 hrs/night), avoidance of caffeine after noon and alcohol  near bedtime, and maintaining a sleep environment that is cool, dark and quiet. 5. Weight loss for overweight patients is recommended.  Even modest amounts of weight loss can significantly improve the severity of sleep apnea. 6. Snoring recommendations include:  weight loss where appropriate, side sleeping, and avoidance of alcohol  before bed. 7. Operation of motor vehicle should be avoided when sleepy.    This study was personally reviewed and electronically signed by: Dr. Wilbert Bihari Accredited Board Certified in Sleep Medicine Date/Time: 04/06/2024 10:25PM "

## 2024-04-07 ENCOUNTER — Telehealth: Payer: Self-pay | Admitting: *Deleted

## 2024-04-07 NOTE — Telephone Encounter (Signed)
-----   Message from Wilbert Bihari, MD sent at 04/06/2024 10:30 PM EST ----- Please let patient know that they have sleep apnea with successful PAP titration.  PAP ordered placed in Epic.  Followup in 6 weeks after starting PAP therapy

## 2024-04-07 NOTE — Telephone Encounter (Signed)
 The patient has been notified of the result and verbalized understanding.  All questions (if any) were answered. Erik West, CMA 04/07/2024 3:29 PM     DME selection is [facility] Patient understands he will be contacted by [facility] to set up his cpap. Patient understands to call if [facility] does not contact him with new setup in a timely manner. Patient understands they will be called once confirmation has been received from [facility] that they have received their new machine to schedule 10 week follow up appointment.   [facility] notified of new cpap order  Please add to airview Patient was grateful for the call and thanked me.

## 2024-04-08 ENCOUNTER — Other Ambulatory Visit (HOSPITAL_COMMUNITY): Payer: Self-pay

## 2024-04-08 ENCOUNTER — Other Ambulatory Visit: Payer: Self-pay

## 2024-05-13 NOTE — Progress Notes (Unsigned)
 "  Cardiology Office Note    Date:  05/13/2024  ID:  Erik West, DOB 11/21/1980, MRN [id number] PCP:  Georgina Speaks, FNP  Cardiologist:  Dorn Lesches, MD  Electrophysiologist:  None   Chief Complaint: ***  History of Present Illness: .    Erik West is a 44 y.o. male with visit-pertinent history of hypertension, OSA and hyperlipidemia.   Patient was initially evaluated by Dr. Lesches on 11/06/2023 at the referral of his PCP for hypertension.  It was noted the patient did not smoke, had no family history of heart disease.  He had never had a heart attack or stroke.  It was noted the patient had hypertension for multiple years on medications.  He denied any anginal symptoms.  Patient's blood pressure was well-controlled at office visit.  Recommended following up with Pharm.D. after 2-week blood pressure log.  Coronary calcium  score was ordered for further stratification.   Coronary calcium  score on 12/06/2023 indicated coronary calcium  score 16.5, 85th percentile for age, race and sex matched control.  It was recommended that patient start on atorvastatin  40 mg daily with recheck fasting lipid profile in 3 months.  LDL goal less than 70.  Patient was seen by Pharm.D. on 12/24/2023, blood pressure was elevated at office visit.  Amlodipine  was increased to 10 mg daily.  Patient was last seen in clinic on 03/20/2024 for follow-up.  He was doing well from a cardiac standpoint.  Patient systolic blood pressure between 130 and 150 mmHg, diastolics in the 80s to 90s mmHg.  Patient's valsartan  increased to 320 mg daily.  Today he presents for follow-up.  He reports that he  Coronary calcium : Coronary calcium  score on 12/06/2023 indicated coronary calcium  score of 16.5, 85th percentile for age, race and sex matched control.  Stable with no anginal symptoms. No indication for ischemic evaluation.   Heart healthy diet and regular cardiovascular exercise encouraged.   Reviewed ED  precautions. Continue Hypertension: Blood pressure today OSA: Hyperlipidemia:  Labwork independently reviewed:   ROS: .   *** denies chest pain, shortness of breath, lower extremity edema, fatigue, palpitations, melena, hematuria, hemoptysis, diaphoresis, weakness, presyncope, syncope, orthopnea, and PND.  All other systems are reviewed and otherwise negative.  Studies Reviewed: SABRA    EKG:  EKG is ordered today, personally reviewed, demonstrating ***     CV Studies: Cardiac studies reviewed are outlined and summarized above. Otherwise please see EMR for full report. Cardiac Studies & Procedures   ______________________________________________________________________________________________          CT SCANS  CT CARDIAC SCORING (SELF PAY ONLY) 12/06/2023  Addendum 12/06/2023  6:56 PM ADDENDUM REPORT: 12/06/2023 18:54  EXAM: OVER-READ INTERPRETATION  CT CHEST  The following report is an over-read performed by radiologist Dr. Fonda Mom [facility] on 12/06/2023. This over-read does not include interpretation of cardiac or coronary anatomy or pathology. The coronary CTA interpretation by the cardiologist is attached.  COMPARISON:  None.  FINDINGS: Cardiovascular:  See findings discussed in the body of the report.  Mediastinum/Nodes: No suspicious adenopathy identified. Imaged mediastinal structures are unremarkable.  Lungs/Pleura: Imaged lungs are clear. No pleural effusion or pneumothorax.  Upper Abdomen: Hepatic hypodensity consistent with a 2 cm cyst.  Musculoskeletal: Mild bilateral gynecomastia. No acute osseous findings.  IMPRESSION: Mild gynecomastia. Hepatic cyst. Otherwise unremarkable over-read.   Electronically Signed By: Fonda Field M.D. On: 12/06/2023 18:54  Narrative CLINICAL DATA:  Cardiovascular Disease Risk stratification  EXAM: Coronary Calcium  Score  TECHNIQUE:  A gated, non-contrast computed tomography scan  of the heart was performed using 3mm slice thickness. Axial images were analyzed on a dedicated workstation. Calcium  scoring of the coronary arteries was performed using the Agatston method.  MEDICATIONS: MEDICATIONS None  FINDINGS: Coronary arteries: Normal origins.  Coronary Calcium  Score:  Left main: 0  Left anterior descending artery: 0  Left circumflex artery: 0.509  Right coronary artery: 16  Total: 16.5  Percentile: 85th  Pericardium: Normal.  Ascending Aorta: Normal caliber.  Non-cardiac: See separate report from [facility].  IMPRESSION: Coronary calcium  score of 16.5 Agatston units. This was 85th percentile for age-, race-, and sex-matched controls.  RECOMMENDATIONS: Coronary artery calcium  (CAC) score is a strong predictor of incident coronary heart disease (CHD) and provides predictive information beyond traditional risk factors. CAC scoring is reasonable to use in the decision to withhold, postpone, or initiate statin therapy in intermediate-risk or selected borderline-risk asymptomatic adults (age 63-75 years and LDL-C >=70 to <190 mg/dL) who do not have diabetes or established atherosclerotic cardiovascular disease (ASCVD).* In intermediate-risk (10-year ASCVD risk >=7.5% to <20%) adults or selected borderline-risk (10-year ASCVD risk >=5% to <7.5%) adults in whom a CAC score is measured for the purpose of making a treatment decision the following recommendations have been made:  If CAC=0, it is reasonable to withhold statin therapy and reassess in 5 to 10 years, as long as higher risk conditions are absent (diabetes mellitus, family history of premature CHD in first degree relatives (males <55 years; females <65 years), cigarette smoking, or LDL >=190 mg/dL).  If CAC is 1 to 99, it is reasonable to initiate statin therapy for patients >=51 years of age.  If CAC is >=100 or >=75th percentile, it is reasonable to initiate statin  therapy at any age.  Cardiology referral should be considered for patients with CAC scores >=400 or >=75th percentile.  *2018 AHA/ACC/AACVPR/AAPA/ABC/ACPM/ADA/AGS/APhA/ASPC/NLA/PCNA Guideline on the Management of Blood Cholesterol: A Report of the American College of Cardiology/American Heart Association Task Force on Clinical Practice Guidelines. J Am Coll Cardiol. 2019;73(24):3168-3209.  Ezra Shuck, MD  Electronically Signed: By: Ezra Shuck M.D. On: 12/06/2023 18:11     ______________________________________________________________________________________________       Current Reported Medications:.    Active Medications[1]  Physical Exam:    VS:  There were no vitals taken for this visit.   Wt Readings from Last 3 Encounters:  03/20/24 253 lb 12.8 oz (115.1 kg)  03/02/24 248 lb (112.5 kg)  01/08/24 254 lb 9.6 oz (115.5 kg)    GEN: Well nourished, well developed in no acute distress NECK: No JVD; No carotid bruits CARDIAC: ***RRR, no murmurs, rubs, gallops RESPIRATORY:  Clear to auscultation without rales, wheezing or rhonchi  ABDOMEN: Soft, non-tender, non-distended EXTREMITIES:  No edema; No acute deformity     Asessement and Plan:.     ***     Disposition: F/u with ***  Signed, Almarie Kurdziel D Natallie Ravenscroft, NP      [1]  No outpatient medications have been marked as taking for the 05/15/24 encounter (Appointment) with Margaretann Abate D, NP.   Current Facility-Administered Medications for the 05/15/24 encounter (Appointment) with Londyn Hotard D, NP  Medication   0.9 %  sodium chloride  infusion   "

## 2024-05-15 ENCOUNTER — Ambulatory Visit: Admitting: Cardiology

## 2024-05-15 DIAGNOSIS — Z79899 Other long term (current) drug therapy: Secondary | ICD-10-CM

## 2024-05-15 DIAGNOSIS — R931 Abnormal findings on diagnostic imaging of heart and coronary circulation: Secondary | ICD-10-CM

## 2024-05-15 DIAGNOSIS — I10 Essential (primary) hypertension: Secondary | ICD-10-CM

## 2024-05-15 DIAGNOSIS — E782 Mixed hyperlipidemia: Secondary | ICD-10-CM

## 2024-05-15 DIAGNOSIS — G4733 Obstructive sleep apnea (adult) (pediatric): Secondary | ICD-10-CM

## 2024-07-22 ENCOUNTER — Ambulatory Visit: Admitting: Cardiology
